# Patient Record
Sex: Female | Born: 1985 | Race: White | Hispanic: No | Marital: Single | Smoking: Never smoker
Health system: Southern US, Community
[De-identification: ages and names within clinical notes are randomized; demographics above are authoritative.]

## PROBLEM LIST (undated history)

## (undated) DIAGNOSIS — F319 Bipolar disorder, unspecified: Secondary | ICD-10-CM

## (undated) DIAGNOSIS — K5900 Constipation, unspecified: Secondary | ICD-10-CM

## (undated) DIAGNOSIS — K859 Acute pancreatitis without necrosis or infection, unspecified: Secondary | ICD-10-CM

## (undated) DIAGNOSIS — G801 Spastic diplegic cerebral palsy: Secondary | ICD-10-CM

## (undated) DIAGNOSIS — J45909 Unspecified asthma, uncomplicated: Secondary | ICD-10-CM

## (undated) HISTORY — PX: CHOLECYSTECTOMY: SHX55

## (undated) HISTORY — PX: OTHER SURGICAL HISTORY: SHX169

## (undated) HISTORY — PX: EYE SURGERY: SHX253

## (undated) HISTORY — PX: BACLOFEN PUMP REFILL: SHX1212

## (undated) HISTORY — PX: APPENDECTOMY: SHX54

## (undated) HISTORY — PX: URINARY DIVERSION: SHX2623

---

## 2012-07-31 ENCOUNTER — Ambulatory Visit (INDEPENDENT_AMBULATORY_CARE_PROVIDER_SITE_OTHER): Payer: Medicaid Other | Admitting: Psychiatry

## 2012-07-31 DIAGNOSIS — F319 Bipolar disorder, unspecified: Secondary | ICD-10-CM

## 2012-07-31 DIAGNOSIS — F0630 Mood disorder due to known physiological condition, unspecified: Secondary | ICD-10-CM

## 2012-08-07 ENCOUNTER — Ambulatory Visit (INDEPENDENT_AMBULATORY_CARE_PROVIDER_SITE_OTHER): Payer: Medicaid Other | Admitting: Psychiatry

## 2012-08-07 DIAGNOSIS — F0630 Mood disorder due to known physiological condition, unspecified: Secondary | ICD-10-CM

## 2012-08-14 ENCOUNTER — Ambulatory Visit (INDEPENDENT_AMBULATORY_CARE_PROVIDER_SITE_OTHER): Payer: Medicaid Other | Admitting: Psychiatry

## 2012-08-14 DIAGNOSIS — F0630 Mood disorder due to known physiological condition, unspecified: Secondary | ICD-10-CM

## 2012-08-14 DIAGNOSIS — F319 Bipolar disorder, unspecified: Secondary | ICD-10-CM

## 2012-08-21 ENCOUNTER — Ambulatory Visit (INDEPENDENT_AMBULATORY_CARE_PROVIDER_SITE_OTHER): Payer: Medicaid Other | Admitting: Psychiatry

## 2012-08-21 DIAGNOSIS — F0630 Mood disorder due to known physiological condition, unspecified: Secondary | ICD-10-CM

## 2012-08-21 DIAGNOSIS — F319 Bipolar disorder, unspecified: Secondary | ICD-10-CM

## 2012-08-28 ENCOUNTER — Ambulatory Visit (INDEPENDENT_AMBULATORY_CARE_PROVIDER_SITE_OTHER): Payer: Medicaid Other | Admitting: Psychiatry

## 2012-08-28 DIAGNOSIS — F0630 Mood disorder due to known physiological condition, unspecified: Secondary | ICD-10-CM

## 2012-08-28 DIAGNOSIS — F319 Bipolar disorder, unspecified: Secondary | ICD-10-CM

## 2012-09-04 ENCOUNTER — Ambulatory Visit (INDEPENDENT_AMBULATORY_CARE_PROVIDER_SITE_OTHER): Payer: Medicaid Other | Admitting: Psychiatry

## 2012-09-04 DIAGNOSIS — F0630 Mood disorder due to known physiological condition, unspecified: Secondary | ICD-10-CM

## 2012-09-04 DIAGNOSIS — F319 Bipolar disorder, unspecified: Secondary | ICD-10-CM

## 2012-09-11 ENCOUNTER — Ambulatory Visit (INDEPENDENT_AMBULATORY_CARE_PROVIDER_SITE_OTHER): Payer: Medicaid Other | Admitting: Psychiatry

## 2012-09-11 DIAGNOSIS — F319 Bipolar disorder, unspecified: Secondary | ICD-10-CM

## 2012-09-11 DIAGNOSIS — F0630 Mood disorder due to known physiological condition, unspecified: Secondary | ICD-10-CM

## 2012-09-18 ENCOUNTER — Ambulatory Visit (INDEPENDENT_AMBULATORY_CARE_PROVIDER_SITE_OTHER): Payer: Medicaid Other | Admitting: Psychiatry

## 2012-09-18 DIAGNOSIS — F0630 Mood disorder due to known physiological condition, unspecified: Secondary | ICD-10-CM

## 2012-09-18 DIAGNOSIS — F319 Bipolar disorder, unspecified: Secondary | ICD-10-CM

## 2012-09-25 ENCOUNTER — Ambulatory Visit (INDEPENDENT_AMBULATORY_CARE_PROVIDER_SITE_OTHER): Payer: Medicaid Other | Admitting: Psychiatry

## 2012-09-25 DIAGNOSIS — F0630 Mood disorder due to known physiological condition, unspecified: Secondary | ICD-10-CM

## 2012-09-25 DIAGNOSIS — F319 Bipolar disorder, unspecified: Secondary | ICD-10-CM

## 2012-10-02 ENCOUNTER — Ambulatory Visit (INDEPENDENT_AMBULATORY_CARE_PROVIDER_SITE_OTHER): Payer: 59 | Admitting: Psychiatry

## 2012-10-02 DIAGNOSIS — F319 Bipolar disorder, unspecified: Secondary | ICD-10-CM

## 2012-10-02 DIAGNOSIS — F0630 Mood disorder due to known physiological condition, unspecified: Secondary | ICD-10-CM

## 2012-10-09 ENCOUNTER — Ambulatory Visit (INDEPENDENT_AMBULATORY_CARE_PROVIDER_SITE_OTHER): Payer: 59 | Admitting: Psychiatry

## 2012-10-09 DIAGNOSIS — F068 Other specified mental disorders due to known physiological condition: Secondary | ICD-10-CM

## 2012-10-09 DIAGNOSIS — F319 Bipolar disorder, unspecified: Secondary | ICD-10-CM

## 2012-10-16 ENCOUNTER — Ambulatory Visit (INDEPENDENT_AMBULATORY_CARE_PROVIDER_SITE_OTHER): Payer: Medicaid Other | Admitting: Psychiatry

## 2012-10-16 DIAGNOSIS — F0630 Mood disorder due to known physiological condition, unspecified: Secondary | ICD-10-CM

## 2012-10-16 DIAGNOSIS — F319 Bipolar disorder, unspecified: Secondary | ICD-10-CM

## 2012-10-23 ENCOUNTER — Ambulatory Visit (INDEPENDENT_AMBULATORY_CARE_PROVIDER_SITE_OTHER): Payer: Medicaid Other | Admitting: Psychiatry

## 2012-10-23 DIAGNOSIS — F0630 Mood disorder due to known physiological condition, unspecified: Secondary | ICD-10-CM

## 2012-10-23 DIAGNOSIS — F319 Bipolar disorder, unspecified: Secondary | ICD-10-CM

## 2012-10-30 ENCOUNTER — Ambulatory Visit (INDEPENDENT_AMBULATORY_CARE_PROVIDER_SITE_OTHER): Payer: Medicaid Other | Admitting: Psychiatry

## 2012-10-30 DIAGNOSIS — F0630 Mood disorder due to known physiological condition, unspecified: Secondary | ICD-10-CM

## 2012-10-30 DIAGNOSIS — F319 Bipolar disorder, unspecified: Secondary | ICD-10-CM

## 2012-11-06 ENCOUNTER — Ambulatory Visit (INDEPENDENT_AMBULATORY_CARE_PROVIDER_SITE_OTHER): Payer: Medicaid Other | Admitting: Psychiatry

## 2012-11-06 DIAGNOSIS — F319 Bipolar disorder, unspecified: Secondary | ICD-10-CM

## 2012-11-06 DIAGNOSIS — F0630 Mood disorder due to known physiological condition, unspecified: Secondary | ICD-10-CM

## 2012-11-13 ENCOUNTER — Ambulatory Visit: Payer: Medicaid Other | Admitting: Psychiatry

## 2012-12-05 ENCOUNTER — Ambulatory Visit (INDEPENDENT_AMBULATORY_CARE_PROVIDER_SITE_OTHER): Payer: Medicaid Other | Admitting: Psychiatry

## 2012-12-05 DIAGNOSIS — F319 Bipolar disorder, unspecified: Secondary | ICD-10-CM

## 2012-12-05 DIAGNOSIS — F0630 Mood disorder due to known physiological condition, unspecified: Secondary | ICD-10-CM

## 2012-12-11 ENCOUNTER — Ambulatory Visit (INDEPENDENT_AMBULATORY_CARE_PROVIDER_SITE_OTHER): Payer: Medicaid Other | Admitting: Psychiatry

## 2012-12-11 DIAGNOSIS — F319 Bipolar disorder, unspecified: Secondary | ICD-10-CM

## 2012-12-11 DIAGNOSIS — F0630 Mood disorder due to known physiological condition, unspecified: Secondary | ICD-10-CM

## 2012-12-25 ENCOUNTER — Ambulatory Visit (INDEPENDENT_AMBULATORY_CARE_PROVIDER_SITE_OTHER): Payer: Medicaid Other | Admitting: Psychiatry

## 2012-12-25 DIAGNOSIS — F319 Bipolar disorder, unspecified: Secondary | ICD-10-CM

## 2012-12-25 DIAGNOSIS — F0630 Mood disorder due to known physiological condition, unspecified: Secondary | ICD-10-CM

## 2013-01-08 ENCOUNTER — Ambulatory Visit (INDEPENDENT_AMBULATORY_CARE_PROVIDER_SITE_OTHER): Payer: Medicaid Other | Admitting: Psychiatry

## 2013-01-08 DIAGNOSIS — F319 Bipolar disorder, unspecified: Secondary | ICD-10-CM

## 2013-01-08 DIAGNOSIS — F0630 Mood disorder due to known physiological condition, unspecified: Secondary | ICD-10-CM

## 2013-01-15 ENCOUNTER — Ambulatory Visit (INDEPENDENT_AMBULATORY_CARE_PROVIDER_SITE_OTHER): Payer: Medicaid Other | Admitting: Psychiatry

## 2013-01-15 DIAGNOSIS — F0630 Mood disorder due to known physiological condition, unspecified: Secondary | ICD-10-CM

## 2013-01-15 DIAGNOSIS — F319 Bipolar disorder, unspecified: Secondary | ICD-10-CM

## 2013-01-22 ENCOUNTER — Ambulatory Visit (INDEPENDENT_AMBULATORY_CARE_PROVIDER_SITE_OTHER): Payer: Medicaid Other | Admitting: Psychiatry

## 2013-01-22 DIAGNOSIS — F319 Bipolar disorder, unspecified: Secondary | ICD-10-CM

## 2013-01-22 DIAGNOSIS — F0630 Mood disorder due to known physiological condition, unspecified: Secondary | ICD-10-CM

## 2013-01-27 ENCOUNTER — Encounter (HOSPITAL_COMMUNITY): Payer: Self-pay | Admitting: *Deleted

## 2013-01-27 ENCOUNTER — Emergency Department (HOSPITAL_COMMUNITY): Payer: Medicaid Other

## 2013-01-27 ENCOUNTER — Emergency Department (HOSPITAL_COMMUNITY)
Admission: EM | Admit: 2013-01-27 | Discharge: 2013-01-27 | Disposition: A | Discharge status: Skilled Nursing Facility | Payer: Medicaid Other | Attending: Emergency Medicine | Admitting: Emergency Medicine

## 2013-01-27 DIAGNOSIS — R109 Unspecified abdominal pain: Secondary | ICD-10-CM | POA: Insufficient documentation

## 2013-01-27 DIAGNOSIS — J45901 Unspecified asthma with (acute) exacerbation: Secondary | ICD-10-CM | POA: Insufficient documentation

## 2013-01-27 DIAGNOSIS — Z8669 Personal history of other diseases of the nervous system and sense organs: Secondary | ICD-10-CM | POA: Insufficient documentation

## 2013-01-27 DIAGNOSIS — Z9089 Acquired absence of other organs: Secondary | ICD-10-CM | POA: Insufficient documentation

## 2013-01-27 DIAGNOSIS — E669 Obesity, unspecified: Secondary | ICD-10-CM | POA: Insufficient documentation

## 2013-01-27 DIAGNOSIS — R05 Cough: Secondary | ICD-10-CM | POA: Insufficient documentation

## 2013-01-27 DIAGNOSIS — R059 Cough, unspecified: Secondary | ICD-10-CM | POA: Insufficient documentation

## 2013-01-27 DIAGNOSIS — F319 Bipolar disorder, unspecified: Secondary | ICD-10-CM | POA: Insufficient documentation

## 2013-01-27 DIAGNOSIS — R0989 Other specified symptoms and signs involving the circulatory and respiratory systems: Secondary | ICD-10-CM | POA: Insufficient documentation

## 2013-01-27 DIAGNOSIS — Z79899 Other long term (current) drug therapy: Secondary | ICD-10-CM | POA: Insufficient documentation

## 2013-01-27 DIAGNOSIS — IMO0002 Reserved for concepts with insufficient information to code with codable children: Secondary | ICD-10-CM | POA: Insufficient documentation

## 2013-01-27 DIAGNOSIS — Z8719 Personal history of other diseases of the digestive system: Secondary | ICD-10-CM | POA: Insufficient documentation

## 2013-01-27 DIAGNOSIS — J069 Acute upper respiratory infection, unspecified: Secondary | ICD-10-CM

## 2013-01-27 HISTORY — DX: Spastic diplegic cerebral palsy: G80.1

## 2013-01-27 HISTORY — DX: Bipolar disorder, unspecified: F31.9

## 2013-01-27 HISTORY — DX: Acute pancreatitis without necrosis or infection, unspecified: K85.90

## 2013-01-27 HISTORY — DX: Unspecified asthma, uncomplicated: J45.909

## 2013-01-27 LAB — CBC WITH DIFFERENTIAL/PLATELET
Basophils Absolute: 0.1 10*3/uL (ref 0.0–0.1)
Basophils Relative: 0 % (ref 0–1)
Eosinophils Absolute: 0.6 10*3/uL (ref 0.0–0.7)
Eosinophils Relative: 4 % (ref 0–5)
Lymphocytes Relative: 32 % (ref 12–46)
MCHC: 32.1 g/dL (ref 30.0–36.0)
MCV: 90.6 fL (ref 78.0–100.0)
Monocytes Absolute: 0.8 10*3/uL (ref 0.1–1.0)
Platelets: 305 10*3/uL (ref 150–400)
RDW: 13 % (ref 11.5–15.5)
WBC: 14 10*3/uL — ABNORMAL HIGH (ref 4.0–10.5)

## 2013-01-27 LAB — COMPREHENSIVE METABOLIC PANEL
ALT: 18 U/L (ref 0–35)
CO2: 23 mEq/L (ref 19–32)
Calcium: 9.1 mg/dL (ref 8.4–10.5)
Creatinine, Ser: 0.55 mg/dL (ref 0.50–1.10)
GFR calc Af Amer: >90 mL/min (ref >90)
GFR calc non Af Amer: >90 mL/min (ref >90)
Glucose, Bld: 98 mg/dL (ref 70–99)
Total Bilirubin: 0.1 mg/dL — ABNORMAL LOW (ref 0.3–1.2)

## 2013-01-27 LAB — URINALYSIS, ROUTINE W REFLEX MICROSCOPIC
Ketones, ur: NEGATIVE mg/dL
Protein, ur: NEGATIVE mg/dL
Urobilinogen, UA: 0.2 mg/dL (ref 0.0–1.0)

## 2013-01-27 LAB — URINE MICROSCOPIC-ADD ON

## 2013-01-27 MED ORDER — SODIUM CHLORIDE 0.9 % IV SOLN: 1000.0000 mL | INTRAVENOUS | Status: DC

## 2013-01-27 MED ORDER — IOHEXOL 350 MG/ML SOLN
100.0000 mL | Freq: Once | INTRAVENOUS | Status: AC | PRN
  Administered 2013-01-27: 100 mL via INTRAVENOUS

## 2013-01-27 MED ORDER — ONDANSETRON HCL 4 MG/2ML IJ SOLN
4.0000 mg | Freq: Once | INTRAMUSCULAR | Status: AC
Start: 2013-01-27 — End: 2013-01-27
  Administered 2013-01-27: 4 mg via INTRAVENOUS
  Filled 2013-01-27: qty 2

## 2013-01-27 MED ORDER — ALBUTEROL SULFATE HFA 108 (90 BASE) MCG/ACT IN AERS: 1.0000 | INHALATION_SPRAY | Freq: Four times a day (QID) | RESPIRATORY_TRACT | Status: AC | PRN

## 2013-01-27 MED ORDER — HYDROMORPHONE HCL PF 1 MG/ML IJ SOLN
1.0000 mg | Freq: Once | INTRAMUSCULAR | Status: AC
  Administered 2013-01-27: 1 mg via INTRAVENOUS
  Filled 2013-01-27: qty 1

## 2013-01-27 MED ORDER — GUAIFENESIN ER 1200 MG PO TB12: 1.0000 | ORAL_TABLET | Freq: Two times a day (BID) | ORAL | Status: DC

## 2013-01-27 MED ORDER — SODIUM CHLORIDE 0.9 % IV SOLN
1000.0000 mL | Freq: Once | INTRAVENOUS | Status: AC
  Administered 2013-01-27: 1000 mL via INTRAVENOUS

## 2013-01-27 NOTE — ED Notes (Signed)
[facility] called for transport.  

## 2013-01-27 NOTE — ED Provider Notes (Addendum)
History     CSN: [id number] Arrival date & time 01/27/13  1733 First MD Initiated Contact with Patient 01/27/13 1739      CC: shortness of breath  HPI Patient presents to the emergency room with complaints of shortness of breath, and cough. The symptoms started about one week ago. She was prescribed an antibiotic which she completed a few days ago. She still feels like she has a lot of congestion in her chest but she can't bring up. Nothing seems to help the symptoms. Patient has cerebral palsy and does not ambulate.  She also has had some moderate left upper quadrant and flank pain. She has a history of pancreatitis and feels that this could be somewhat similar. She has not had any trouble with nausea vomiting or diarrhea. She has had some constipation and was treated for that last week. Past Medical History  Diagnosis Date  . Asthma   . Bipolar 1 disorder   . Cerebral palsy with spastic/ataxic diplegia     spastic  . Pancreatitis     Past Surgical History  Procedure Laterality Date  . Appendectomy    . Cholecystectomy    . Major lower body reconstruction    . Eye surgery    . Baclofen pump refill      RLQ    Family History  Problem Relation Age of Onset  . Family history unknown: Yes    History  Substance Use Topics  . Smoking status: Never Smoker   . Smokeless tobacco: Not on file  . Alcohol Use: 0.6 oz/week    1 Shots of liquor per week     Comment: social drinker 1x  a moth     OB History   Grav Para Term Preterm Abortions TAB SAB Ect Mult Living   0 0 0 0 0 0 0 0 0 0       Review of Systems  All other systems reviewed and are negative.    Allergies  Methadone hcl; Morphine and related; and Versed  Home Medications   Current Outpatient Rx  Name  Route  Sig  Dispense  Refill  . albuterol (PROVENTIL) (2.5 MG/3ML) 0.083% nebulizer solution   Nebulization   Take 2.5 mg by nebulization every 4 (four) hours as needed (cough and wheezing).         .  ARIPiprazole (ABILIFY) 2 MG tablet   Oral   Take 2 mg by mouth at bedtime.         . benzonatate (TESSALON) 100 MG capsule   Oral   Take 100 mg by mouth 3 (three) times daily as needed (cough).         . cholecalciferol (VITAMIN D) 1000 UNITS tablet   Oral   Take 1,000 Units by mouth every morning.         . gabapentin (NEURONTIN) 100 MG capsule   Oral   Take 100 mg by mouth 2 (two) times daily.         Marland Kitchen lithium carbonate 150 MG capsule   Oral   Take 150 mg by mouth at bedtime. Also takes 2 (300mg ) tablets together         . lithium carbonate 300 MG capsule   Oral   Take 600 mg by mouth at bedtime. Also takes 1 (150mg ) tablet together         . loratadine (CLARITIN) 10 MG tablet   Oral   Take 10 mg by mouth daily as needed (post-nasal drip).         Marland Kitchen  neomycin-polymyxin-hydrocortisone (CORTISPORIN) otic solution   Otic   Place 2 drops in ear(s) 4 (four) times daily.         Marland Kitchen oxyCODONE-acetaminophen (PERCOCET/ROXICET) 5-325 MG per tablet   Oral   Take 1 tablet by mouth every 6 (six) hours as needed (pain).         . polyethylene glycol (MIRALAX / GLYCOLAX) packet   Oral   Take 17 g by mouth daily.         . tamsulosin (FLOMAX) 0.4 MG CAPS   Oral   Take 0.4 mg by mouth daily after supper.           BP 131/74  Temp(Src) 99.3 F (37.4 C) (Oral)  Resp 17  SpO2 100%  Physical Exam  Nursing note and vitals reviewed. Constitutional: No distress.  Obese   HENT:  Head: Normocephalic and atraumatic.  Right Ear: External ear normal.  Left Ear: External ear normal.  Eyes: Conjunctivae are normal. Right eye exhibits no discharge. Left eye exhibits no discharge. No scleral icterus.  Neck: Neck supple. No tracheal deviation present.  Cardiovascular: Normal rate, regular rhythm and intact distal pulses.   Pulmonary/Chest: Effort normal and breath sounds normal. No stridor. No respiratory distress. She has no wheezes. She has no rales.   Abdominal: Soft. Bowel sounds are normal. She exhibits no distension. There is tenderness. There is no rebound and no guarding.  Mild epigastrum and luq  Musculoskeletal: She exhibits no edema and no tenderness.  Neurological: She is alert. She is not disoriented. No sensory deficit. Cranial nerve deficit:  no gross defecits noted. She exhibits abnormal muscle tone. She displays no seizure activity.  Weakness bilateral lower extrem, ue strength equal bilaterally  Skin: Skin is warm and dry. No rash noted.  Psychiatric: She has a normal mood and affect.    ED Course  Procedures (including critical care time) EKG Normal sinus rhythm Rate 80 Normal axis normal intervals Nonspecific T wave changes No prior EKG for comparison Labs Reviewed  COMPREHENSIVE METABOLIC PANEL - Abnormal; Notable for the following:    Total Bilirubin 0.1 (*)    All other components within normal limits  URINALYSIS, ROUTINE W REFLEX MICROSCOPIC - Abnormal; Notable for the following:    Hgb urine dipstick TRACE (*)    Leukocytes, UA SMALL (*)    All other components within normal limits  CBC WITH DIFFERENTIAL - Abnormal; Notable for the following:    WBC 14.0 (*)    Neutro Abs 8.0 (*)    Lymphs Abs 4.5 (*)    All other components within normal limits  URINE MICROSCOPIC-ADD ON - Abnormal; Notable for the following:    Squamous Epithelial / LPF FEW (*)    All other components within normal limits  LIPASE, BLOOD   Dg Abd Acute W/chest  01/27/2013  *RADIOLOGY REPORT*  Clinical Data: Shortness of breath.  Abdominal pain.  ACUTE ABDOMEN SERIES (ABDOMEN 2 VIEW & CHEST 1 VIEW)  Comparison: None.  Findings: There are low lung volumes.  The heart size and mediastinal contours are normal.  The lungs are clear.  There is no pleural effusion or pneumothorax.  Multiple telemetry leads overlie the chest.  There is an intrathecal catheter with an imbedded pump in the right abdominal wall.  The bowel gas pattern appears  nonobstructive. There is mildly prominent stool within the right colon.  There is no free intraperitoneal air or suspicious calcification. Cholecystectomy clips are noted.  There are bilateral pelvic calcifications  which are likely phleboliths.  Both hips demonstrate coxa valga deformities and femoral head irregularities suspicious for possible chronic osteonecrosis.  IMPRESSION: No acute cardiopulmonary or abdominal process identified.   Original Report Authenticated By: Carey Bullocks, M.D.      1. URI, acute   2. Abdominal pain       MDM  Labs and xrays unremarkable.  Room air o2 sat in mid to low 90s.  At times tachycardic. No history of PE however pt is imobile.  Previously she has had elevated d dimers with normal CT scans.  Will proceed with CT to rule out PE.  CT scan did not show evidence of pulmonary embolism. I doubt pulmonary edema at her age. The patient's abdominal exam is benign. Her symptoms are most likely related to a viral infection. Patient be discharged home and recommend follow up with her primary doctor this week        Celene Kras, MD 01/27/13 2138  Celene Kras, MD 01/27/13 2145

## 2013-01-27 NOTE — ED Notes (Signed)
Patient transported to CT 

## 2013-01-27 NOTE — ED Notes (Signed)
EAV:[id number]> Expected date:01/27/13<BR> Expected time: 5:23 PM<BR> Means of arrival:Ambulance<BR> Comments:<BR> LRQ pain, SHob

## 2013-01-27 NOTE — ED Notes (Signed)
Pt's O2 went down to 88% and held around 91%.  Put pt on 1LNC O2 pt's O2 sat now 97%.  MD made aware.

## 2013-01-27 NOTE — ED Notes (Signed)
Patient had URI two weeks ago was taking an antibiotic just finished up a few days ago.  Patient c/o of SOB and LLQ pain radiating flank of back.

## 2013-01-29 ENCOUNTER — Ambulatory Visit: Payer: Medicaid Other | Admitting: Psychiatry

## 2013-01-30 ENCOUNTER — Inpatient Hospital Stay (HOSPITAL_COMMUNITY)
Admission: EM | Admit: 2013-01-30 | Discharge: 2013-02-02 | DRG: 690 | Disposition: A | Discharge status: Home / Self Care | Payer: Medicaid Other | Attending: Internal Medicine | Admitting: Internal Medicine

## 2013-01-30 DIAGNOSIS — K5900 Constipation, unspecified: Secondary | ICD-10-CM | POA: Diagnosis present

## 2013-01-30 DIAGNOSIS — G808 Other cerebral palsy: Secondary | ICD-10-CM | POA: Diagnosis present

## 2013-01-30 DIAGNOSIS — N10 Acute tubulo-interstitial nephritis: Principal | ICD-10-CM | POA: Diagnosis present

## 2013-01-30 DIAGNOSIS — Z79899 Other long term (current) drug therapy: Secondary | ICD-10-CM

## 2013-01-30 DIAGNOSIS — F319 Bipolar disorder, unspecified: Secondary | ICD-10-CM | POA: Diagnosis present

## 2013-01-30 DIAGNOSIS — G801 Spastic diplegic cerebral palsy: Secondary | ICD-10-CM

## 2013-01-30 DIAGNOSIS — J45909 Unspecified asthma, uncomplicated: Secondary | ICD-10-CM | POA: Diagnosis present

## 2013-01-30 DIAGNOSIS — E860 Dehydration: Secondary | ICD-10-CM

## 2013-01-30 LAB — CBC WITH DIFFERENTIAL/PLATELET
HCT: 40.5 % (ref 36.0–46.0)
Hemoglobin: 13.6 g/dL (ref 12.0–15.0)
Lymphocytes Relative: 19 % (ref 12–46)
Lymphs Abs: 2.8 10*3/uL (ref 0.7–4.0)
MCHC: 33.6 g/dL (ref 30.0–36.0)
Monocytes Absolute: 0.8 10*3/uL (ref 0.1–1.0)
Monocytes Relative: 5 % (ref 3–12)
Neutro Abs: 11.1 10*3/uL — ABNORMAL HIGH (ref 1.7–7.7)
Neutrophils Relative %: 74 % (ref 43–77)
RBC: 4.54 MIL/uL (ref 3.87–5.11)

## 2013-01-30 LAB — COMPREHENSIVE METABOLIC PANEL
Albumin: 3.7 g/dL (ref 3.5–5.2)
Alkaline Phosphatase: 115 U/L (ref 39–117)
BUN: 8 mg/dL (ref 6–23)
CO2: 23 mEq/L (ref 19–32)
Chloride: 107 mEq/L (ref 96–112)
Creatinine, Ser: 0.51 mg/dL (ref 0.50–1.10)
GFR calc non Af Amer: >90 mL/min (ref >90)
Glucose, Bld: 146 mg/dL — ABNORMAL HIGH (ref 70–99)
Potassium: 4.5 mEq/L (ref 3.5–5.1)
Total Bilirubin: <0.1 mg/dL — ABNORMAL LOW (ref 0.3–1.2)

## 2013-01-30 LAB — URINE MICROSCOPIC-ADD ON

## 2013-01-30 LAB — URINALYSIS, ROUTINE W REFLEX MICROSCOPIC
Bilirubin Urine: NEGATIVE
Ketones, ur: NEGATIVE mg/dL
Nitrite: NEGATIVE
Protein, ur: NEGATIVE mg/dL
pH: 7 (ref 5.0–8.0)

## 2013-01-30 LAB — LIPASE, BLOOD: Lipase: 19 U/L (ref 11–59)

## 2013-01-30 MED ORDER — FLEET ENEMA 7-19 GM/118ML RE ENEM
1.0000 | ENEMA | Freq: Once | RECTAL | Status: AC
  Administered 2013-01-30: 1 via RECTAL
  Filled 2013-01-30: qty 1

## 2013-01-30 MED ORDER — SODIUM CHLORIDE 0.9 % IV BOLUS (SEPSIS)
1000.0000 mL | Freq: Once | INTRAVENOUS | Status: AC
  Administered 2013-01-30: 1000 mL via INTRAVENOUS

## 2013-01-30 MED ORDER — DEXTROSE 5 % IV SOLN
1.0000 g | Freq: Once | INTRAVENOUS | Status: AC
  Administered 2013-01-30: 1 g via INTRAVENOUS
  Filled 2013-01-30: qty 10

## 2013-01-30 MED ORDER — ONDANSETRON HCL 4 MG/2ML IJ SOLN
4.0000 mg | Freq: Once | INTRAMUSCULAR | Status: AC
  Administered 2013-01-30: 4 mg via INTRAVENOUS
  Filled 2013-01-30: qty 2

## 2013-01-30 NOTE — ED Notes (Signed)
Patient is a resident of [facility] who was on the city bus when she started vomiting. Patient states she thinks she has a bowel obstruction. She has not had a BM in 5 days.

## 2013-01-31 ENCOUNTER — Encounter (HOSPITAL_COMMUNITY): Payer: Self-pay | Admitting: *Deleted

## 2013-01-31 DIAGNOSIS — F319 Bipolar disorder, unspecified: Secondary | ICD-10-CM

## 2013-01-31 DIAGNOSIS — E860 Dehydration: Secondary | ICD-10-CM

## 2013-01-31 DIAGNOSIS — N10 Acute tubulo-interstitial nephritis: Secondary | ICD-10-CM | POA: Diagnosis present

## 2013-01-31 DIAGNOSIS — G801 Spastic diplegic cerebral palsy: Secondary | ICD-10-CM | POA: Diagnosis present

## 2013-01-31 DIAGNOSIS — G808 Other cerebral palsy: Secondary | ICD-10-CM

## 2013-01-31 LAB — HEMOGLOBIN A1C: Mean Plasma Glucose: 117 mg/dL — ABNORMAL HIGH (ref <117)

## 2013-01-31 LAB — LITHIUM LEVEL: Lithium Lvl: <0.25 mEq/L — ABNORMAL LOW (ref 0.80–1.40)

## 2013-01-31 MED ORDER — LITHIUM CARBONATE 150 MG PO CAPS
750.0000 mg | ORAL_CAPSULE | Freq: Every day | ORAL | Status: DC
  Administered 2013-01-31 – 2013-02-01 (×3): 750 mg via ORAL
  Filled 2013-01-31 (×4): qty 1

## 2013-01-31 MED ORDER — POLYETHYLENE GLYCOL 3350 17 G PO PACK
17.0000 g | PACK | Freq: Every day | ORAL | Status: DC
  Administered 2013-01-31 – 2013-02-02 (×3): 17 g via ORAL
  Filled 2013-01-31 (×3): qty 1

## 2013-01-31 MED ORDER — ALBUTEROL SULFATE (5 MG/ML) 0.5% IN NEBU: 2.5000 mg | INHALATION_SOLUTION | RESPIRATORY_TRACT | Status: DC | PRN

## 2013-01-31 MED ORDER — NEOMYCIN-POLYMYXIN-HC 3.5-10000-1 OT SOLN
2.0000 [drp] | Freq: Four times a day (QID) | OTIC | Status: DC
  Administered 2013-01-31 – 2013-02-02 (×10): 2 [drp] via OTIC
  Filled 2013-01-31: qty 10

## 2013-01-31 MED ORDER — ALBUTEROL SULFATE HFA 108 (90 BASE) MCG/ACT IN AERS: 1.0000 | INHALATION_SPRAY | Freq: Four times a day (QID) | RESPIRATORY_TRACT | Status: DC | PRN

## 2013-01-31 MED ORDER — OXYCODONE-ACETAMINOPHEN 5-325 MG PO TABS
1.0000 | ORAL_TABLET | Freq: Four times a day (QID) | ORAL | Status: DC | PRN
  Administered 2013-01-31 (×2): 1 via ORAL
  Filled 2013-01-31 (×2): qty 1

## 2013-01-31 MED ORDER — LITHIUM CARBONATE 150 MG PO CAPS: 150.0000 mg | ORAL_CAPSULE | Freq: Every day | ORAL | Status: DC

## 2013-01-31 MED ORDER — OXYCODONE-ACETAMINOPHEN 5-325 MG PO TABS
1.0000 | ORAL_TABLET | ORAL | Status: DC | PRN
  Administered 2013-01-31 – 2013-02-02 (×4): 1 via ORAL
  Filled 2013-01-31 (×4): qty 1

## 2013-01-31 MED ORDER — HYDROCERIN EX CREA
1.0000 "application " | TOPICAL_CREAM | Freq: Every day | CUTANEOUS | Status: DC
  Administered 2013-01-31 – 2013-02-01 (×2): 1 via TOPICAL
  Filled 2013-01-31: qty 113

## 2013-01-31 MED ORDER — SODIUM CHLORIDE 0.9 % IV SOLN
INTRAVENOUS | Status: DC
  Administered 2013-01-31 – 2013-02-02 (×5): via INTRAVENOUS

## 2013-01-31 MED ORDER — VITAMIN D3 25 MCG (1000 UNIT) PO TABS
1000.0000 [IU] | ORAL_TABLET | Freq: Every morning | ORAL | Status: DC
  Administered 2013-01-31 – 2013-02-02 (×3): 1000 [IU] via ORAL
  Filled 2013-01-31 (×3): qty 1

## 2013-01-31 MED ORDER — ARIPIPRAZOLE 2 MG PO TABS
4.0000 mg | ORAL_TABLET | Freq: Every day | ORAL | Status: DC
  Administered 2013-01-31 – 2013-02-01 (×3): 4 mg via ORAL
  Filled 2013-01-31 (×4): qty 2

## 2013-01-31 MED ORDER — ALPRAZOLAM 0.5 MG PO TABS: 0.5000 mg | ORAL_TABLET | Freq: Three times a day (TID) | ORAL | Status: DC | PRN

## 2013-01-31 MED ORDER — ENOXAPARIN SODIUM 40 MG/0.4ML ~~LOC~~ SOLN
40.0000 mg | SUBCUTANEOUS | Status: DC
  Administered 2013-01-31 – 2013-02-02 (×3): 40 mg via SUBCUTANEOUS
  Filled 2013-01-31 (×3): qty 0.4

## 2013-01-31 MED ORDER — ACETAMINOPHEN 325 MG PO TABS: 650.0000 mg | ORAL_TABLET | ORAL | Status: DC | PRN

## 2013-01-31 MED ORDER — SENNOSIDES-DOCUSATE SODIUM 8.6-50 MG PO TABS
3.0000 | ORAL_TABLET | Freq: Two times a day (BID) | ORAL | Status: DC
  Administered 2013-01-31 – 2013-02-02 (×5): 3 via ORAL
  Filled 2013-01-31 (×5): qty 3

## 2013-01-31 MED ORDER — BISACODYL 10 MG RE SUPP: 10.0000 mg | Freq: Every day | RECTAL | Status: DC | PRN

## 2013-01-31 MED ORDER — HYDROMORPHONE HCL PF 1 MG/ML IJ SOLN
1.0000 mg | INTRAMUSCULAR | Status: DC | PRN
Start: 2013-01-31 — End: 2013-02-02
  Administered 2013-01-31 – 2013-02-01 (×2): 1 mg via INTRAVENOUS
  Filled 2013-01-31 (×2): qty 1

## 2013-01-31 MED ORDER — TAMSULOSIN HCL 0.4 MG PO CAPS
0.4000 mg | ORAL_CAPSULE | Freq: Every day | ORAL | Status: DC
  Administered 2013-01-31 – 2013-02-01 (×2): 0.4 mg via ORAL
  Filled 2013-01-31 (×3): qty 1

## 2013-01-31 MED ORDER — ONDANSETRON HCL 4 MG PO TABS: 4.0000 mg | ORAL_TABLET | Freq: Four times a day (QID) | ORAL | Status: DC | PRN

## 2013-01-31 MED ORDER — FLEET ENEMA 7-19 GM/118ML RE ENEM: 1.0000 | ENEMA | RECTAL | Status: DC | PRN

## 2013-01-31 MED ORDER — ONDANSETRON HCL 4 MG/2ML IJ SOLN
4.0000 mg | Freq: Four times a day (QID) | INTRAMUSCULAR | Status: DC | PRN
  Administered 2013-01-31 (×2): 4 mg via INTRAVENOUS
  Filled 2013-01-31 (×2): qty 2

## 2013-01-31 MED ORDER — BACLOFEN 10 MG PO TABS
10.0000 mg | ORAL_TABLET | Freq: Four times a day (QID) | ORAL | Status: DC | PRN
  Filled 2013-01-31: qty 1

## 2013-01-31 MED ORDER — LORATADINE 10 MG PO TABS: 10.0000 mg | ORAL_TABLET | Freq: Every day | ORAL | Status: DC | PRN

## 2013-01-31 MED ORDER — MORPHINE SULFATE 2 MG/ML IJ SOLN
2.0000 mg | INTRAMUSCULAR | Status: DC | PRN
  Filled 2013-01-31: qty 1

## 2013-01-31 MED ORDER — DEXTROSE 5 % IV SOLN
1.0000 g | INTRAVENOUS | Status: DC
  Administered 2013-01-31 – 2013-02-01 (×2): 1 g via INTRAVENOUS
  Filled 2013-01-31 (×3): qty 10

## 2013-01-31 MED ORDER — GABAPENTIN 100 MG PO CAPS
100.0000 mg | ORAL_CAPSULE | Freq: Two times a day (BID) | ORAL | Status: DC
  Administered 2013-01-31 – 2013-02-02 (×6): 100 mg via ORAL
  Filled 2013-01-31 (×7): qty 1

## 2013-01-31 MED ORDER — LITHIUM CARBONATE 300 MG PO CAPS: 600.0000 mg | ORAL_CAPSULE | Freq: Every day | ORAL | Status: DC

## 2013-01-31 NOTE — Progress Notes (Signed)
Patient seen and examined by my colleague Dr. Conley Rolls. Patient seen and examined by me, database and chart reviewed. History of cerebral palsy and bipolar disorder on lithium. She came in with acute pyelonephritis, on Rocephin.  Clint Lipps Pager: [phone] 01/31/2013, 12:29 PM

## 2013-01-31 NOTE — Care Management Note (Signed)
    Page 1 of 1   02/02/2013     4:42:37 PM   CARE MANAGEMENT NOTE 02/02/2013  Patient:  Brianna Castillo, Brianna Castillo   Account Number:  [id number]  Date Initiated:  01/31/2013  Documentation initiated by:  Letha Cape  Subjective/Objective Assessment:   dx pyelonephritis  admit- from the [facility] - ALF.     Action/Plan:   Anticipated DC Date:  02/02/2013   Anticipated DC Plan:  ASSISTED LIVING / REST HOME  In-house referral  Clinical Social Worker      DC Planning Services  CM consult      Choice offered to / List presented to:             Status of service:  Completed, signed off Medicare Important Message given?   (If response is "NO", the following Medicare IM given date fields will be blank) Date Medicare IM given:   Date Additional Medicare IM given:    Discharge Disposition:  ASSISTED LIVING  Per UR Regulation:  Reviewed for med. necessity/level of care/duration of stay  If discussed at Long Length of Stay Meetings, dates discussed:    Comments:  01/31/13 9:04 Letha Cape RN, BSN [phone] patient is from the [facility] the ALF, CSW referral.

## 2013-01-31 NOTE — H&P (Signed)
[facility] History and Physical  Brianna Castillo FAO:[id number] DOB: 12/01/1985    PCP:   None  Chief Complaint: Flank pain, nausea and vomiting.  HPI:  Brianna Castillo is an 27 y.o. female with history of cerebral palsy, asthma, bipolar disorder on lithium, presents to the emergency room from assisted living, having vomited once when she was at a bus stop. She admitted to having low back pain with nausea and vomiting. She also feels subjective fever. She denied abdominal cramps or pain, black stool bloody stool. Evaluation in emergency room showed a urinalysis with evidence of infection. She has a leukocytosis with white count 15.6 thousand, hemoglobin of 13.6 g per DL. Her renal function tests were normal, and her liver function tests are normal as well. She was given intravenous Rocephin. Hospitalist was asked to admit her for further treatment since she has nausea vomiting and is coming from an assisted living.  Rewiew of Systems:  Constitutional: Negative for malaise,  No significant weight loss or weight gain Eyes: Negative for eye pain, redness and discharge, diplopia, visual changes, or flashes of light. ENMT: Negative for ear pain, hoarseness, nasal congestion, sinus pressure and sore throat. No headaches; tinnitus, drooling, or problem swallowing. Cardiovascular: Negative for chest pain, palpitations, diaphoresis, dyspnea and peripheral edema. ; No orthopnea, PND Respiratory: Negative for cough, hemoptysis, wheezing and stridor. No pleuritic chestpain. Gastrointestinal: Negative for  diarrhea, constipation, abdominal pain, melena, blood in stool, hematemesis, jaundice and rectal bleeding.    Genitourinary: Negative for frequency,  incontinence,flank pain and hematuria; Musculoskeletal: Negative for back pain and neck pain. Negative for swelling and trauma.;  Skin: . Negative for pruritus, rash, abrasions, bruising and skin lesion.; ulcerations Neuro: Negative for headache,  lightheadedness and neck stiffness. Negative for weakness, altered level of consciousness , altered mental status, extremity weakness, burning feet, involuntary movement, seizure and syncope.  Psych: negative for anxiety, depression, insomnia, tearfulness, panic attacks, hallucinations, paranoia, suicidal or homicidal ideation   Past Medical History  Diagnosis Date  . Asthma   . Bipolar 1 disorder   . Cerebral palsy with spastic/ataxic diplegia     spastic  . Pancreatitis     Past Surgical History  Procedure Laterality Date  . Appendectomy    . Cholecystectomy    . Major lower body reconstruction    . Eye surgery    . Baclofen pump refill      RLQ    Medications:  HOME MEDS: Prior to Admission medications   Medication Sig Start Date End Date Taking? Authorizing Provider  acetaminophen (TYLENOL) 325 MG tablet Take 650 mg by mouth every 4 (four) hours as needed for pain or fever.   Yes Historical Provider, MD  Acetaminophen-Caff-Pyrilamine (MIDOL MAX ST MENSTRUAL FORMULA PO) Take 2 tablets by mouth every 6 (six) hours as needed (for menstrual cramps).   Yes Historical Provider, MD  albuterol (PROVENTIL HFA;VENTOLIN HFA) 108 (90 BASE) MCG/ACT inhaler Inhale 1-2 puffs into the lungs every 6 (six) hours as needed for wheezing. 01/27/13  Yes Celene Kras, MD  albuterol (PROVENTIL) (2.5 MG/3ML) 0.083% nebulizer solution Take 2.5 mg by nebulization every 4 (four) hours as needed (cough and wheezing).   Yes Historical Provider, MD  ALPRAZolam Prudy Feeler) 0.5 MG tablet Take 0.5 mg by mouth every 8 (eight) hours as needed for anxiety.   Yes Historical Provider, MD  ARIPiprazole (ABILIFY) 2 MG tablet Take 4 mg by mouth at bedtime.    Yes Historical Provider, MD  baclofen (LIORESAL)  10 MG tablet Take 10 mg by mouth 4 (four) times daily as needed (for spasms).   Yes Historical Provider, MD  benzonatate (TESSALON) 100 MG capsule Take 100 mg by mouth 3 (three) times daily as needed (cough).   Yes  Historical Provider, MD  bisacodyl (DULCOLAX) 10 MG suppository Place 10 mg rectally daily as needed for constipation.   Yes Historical Provider, MD  cholecalciferol (VITAMIN D) 1000 UNITS tablet Take 1,000 Units by mouth every morning.   Yes Historical Provider, MD  gabapentin (NEURONTIN) 100 MG capsule Take 100 mg by mouth 2 (two) times daily.   Yes Historical Provider, MD  Guaifenesin 1200 MG TB12 Take 1 tablet (1,200 mg total) by mouth 2 (two) times daily at 10 AM and 5 PM. 01/27/13  Yes Celene Kras, MD  hydrocortisone 2.5 % lotion Apply 1 application topically 4 (four) times daily as needed (itching to lower back).   Yes Historical Provider, MD  ibuprofen (ADVIL,MOTRIN) 100 MG/5ML suspension Take 200 mg by mouth every 6 (six) hours as needed for pain.   Yes Historical Provider, MD  lithium carbonate 150 MG capsule Take 150 mg by mouth at bedtime. Also takes 2 (300mg ) tablets together   Yes Historical Provider, MD  lithium carbonate 300 MG capsule Take 600 mg by mouth at bedtime. Also takes 1 (150mg ) tablet together   Yes Historical Provider, MD  loratadine (CLARITIN) 10 MG tablet Take 10 mg by mouth daily as needed (post-nasal drip).   Yes Historical Provider, MD  neomycin-polymyxin-hydrocortisone (CORTISPORIN) otic solution Place 2 drops in ear(s) 4 (four) times daily.   Yes Historical Provider, MD  ondansetron (ZOFRAN) 4 MG tablet Take 4 mg by mouth every 6 (six) hours as needed for nausea.   Yes Historical Provider, MD  oxyCODONE-acetaminophen (PERCOCET/ROXICET) 5-325 MG per tablet Take 1 tablet by mouth every 6 (six) hours as needed (pain).   Yes Historical Provider, MD  phenol (CHLORASEPTIC) 1.4 % LIQD Use as directed 2 sprays in the mouth or throat every 2 (two) hours as needed (for sore throat).   Yes Historical Provider, MD  polyethylene glycol (MIRALAX / GLYCOLAX) packet Take 17 g by mouth daily.   Yes Historical Provider, MD  senna-docusate (SENOKOT-S) 8.6-50 MG per tablet Take 3 tablets  by mouth daily as needed for constipation.   Yes Historical Provider, MD  Skin Protectants, Misc. (MINERIN) CREA Apply 1 application topically at bedtime.   Yes Historical Provider, MD  sodium phosphate (FLEET) enema Place 1 enema rectally every three (3) days as needed (for constipation). follow package directions   Yes Historical Provider, MD  tamsulosin (FLOMAX) 0.4 MG CAPS Take 0.4 mg by mouth daily after supper.   Yes Historical Provider, MD     Allergies:  Allergies  Allergen Reactions  . Methadone Hcl   . Morphine And Related     hypoxic  . Versed (Midazolam)     Social History:   reports that she has never smoked. She does not have any smokeless tobacco history on file. She reports that she drinks about 0.6 ounces of alcohol per week. Her drug history is not on file.  Family History: Family History  Problem Relation Age of Onset  . Family history unknown: Yes     Physical Exam: Filed Vitals:   01/30/13 2315 01/30/13 2345 01/31/13 0000 01/31/13 0003  BP: 135/89 124/81  130/73  Pulse: 98 116  103  Temp:   98.8 F (37.1 C)   TempSrc:  Rectal   Resp:    18  SpO2: 99% 100%  98%   Blood pressure 130/73, pulse 103, temperature 98.8 F (37.1 C), temperature source Rectal, resp. rate 18, SpO2 98.00%.  GEN:  Pleasant  patient lying in the stretcher in no acute distress; cooperative with exam. PSYCH:  alert and oriented x4; does not appear anxious or depressed; affect is appropriate. HEENT: Mucous membranes pink and anicteric; PERRLA; EOM intact; no cervical lymphadenopathy nor thyromegaly or carotid bruit; no JVD; There were no stridor. Neck is very supple. Breasts:: Not examined CHEST WALL: No tenderness CHEST: Normal respiration, clear to auscultation bilaterally.  HEART: Regular rate and rhythm.  There are no murmur, rub, or gallops.   BACK: No kyphosis or scoliosis; no CVA tenderness ABDOMEN: soft and non-tender; no masses, no organomegaly, normal abdominal bowel  sounds; no pannus; no intertriginous candida. There is no rebound and no distention. Rectal Exam: Not done EXTREMITIES: No bone or joint deformity; age-appropriate arthropathy of the hands and knees; no edema; no ulcerations.  There is no calf tenderness. Genitalia: not examined PULSES: 2+ and symmetric SKIN: Normal hydration no rash or ulceration CNS: Cranial nerves 2-12 grossly intact no focal lateralizing neurologic deficit.  Speech is fluent; uvula elevated with phonation, facial symmetry and tongue midline lower extremities are very weak.  Labs on Admission:  Basic Metabolic Panel:  Recent Labs Lab 01/27/13 1810 01/30/13 2150  NA 141 141  K 3.7 4.5  CL 108 107  CO2 23 23  GLUCOSE 98 146*  BUN 7 8  CREATININE 0.55 0.51  CALCIUM 9.1 9.6   Liver Function Tests:  Recent Labs Lab 01/27/13 1810 01/30/13 2150  AST 16 24  ALT 18 28  ALKPHOS 112 115  BILITOT 0.1* <0.1*  PROT 7.2 7.3  ALBUMIN 3.7 3.7    Recent Labs Lab 01/27/13 1810 01/30/13 2150  LIPASE 22 19   No results found for this basename: AMMONIA,  in the last 168 hours CBC:  Recent Labs Lab 01/27/13 1810 01/30/13 2150  WBC 14.0* 15.0*  NEUTROABS 8.0* 11.1*  HGB 13.6 13.6  HCT 42.4 40.5  MCV 90.6 89.2  PLT 305 304   Cardiac Enzymes: No results found for this basename: CKTOTAL, CKMB, CKMBINDEX, TROPONINI,  in the last 168 hours  CBG: No results found for this basename: GLUCAP,  in the last 168 hours   Radiological Exams on Admission: No results found.   Assessment/Plan Present on Admission:  . Pyelonephritis, acute . Dehydration . Cerebral palsy with spastic/ataxic diplegia . Bipolar 1 disorder  PLAN:  Will admit her for acute pyelonephritis. I will give her IV Rocephin along with IV fluid for her dehydration. When she first presented she did have tachycardia with heart rate 120, after IV fluid however it is now less than 100.  We'll give her Tylenol for fever. Since she has bipolar  illness and is currently on lithium, we'll check a level and continue her lithium. With respect to her cerebral palsy, she has been there long quite well using her motorized wheelchair. She is stable, full code, and will be [facility]. I have continued her chronic medications. Thank you for allowing me to participate in the care of your nice patient  Other plans as per orders.  Code Status: Full code   Rosene Pilling, MD. [facility] Pager [phone] 7pm to 7am.  01/31/2013, 1:26 AM

## 2013-01-31 NOTE — ED Provider Notes (Signed)
History     CSN: [id number]  Arrival date & time 01/30/13  2022   First MD Initiated Contact with Patient 01/30/13 2116      Chief Complaint  Patient presents with  . Emesis    (Consider location/radiation/quality/duration/timing/severity/associated sxs/prior treatment) HPI History provided by pt.   Pt w/ mild CP who resides in ALF, presents w/ c/o left-sided abdominal pain w/ radiation through to the back x several days.  Associated w/ increased urinary frequency, vomiting and constipation.  Per prior chart, pt seen in ED for abd pain as well as URI sx on 01/27/13.  Acute abdominal series showed moderate stool burden.  Pt has been taking miralax qod as well as senna but has not had a BM in 5 days.  U/A was obtained via in and out cath at that visit and patient is concerned she may have developed a UTI.  She denies fever, hematemesis and other GU sx.  H/o pancreatitis. Past abd surgeries include appendectomy and cholecystectomy.   Past Medical History  Diagnosis Date  . Asthma   . Bipolar 1 disorder   . Cerebral palsy with spastic/ataxic diplegia     spastic  . Pancreatitis     Past Surgical History  Procedure Laterality Date  . Appendectomy    . Cholecystectomy    . Major lower body reconstruction    . Eye surgery    . Baclofen pump refill      RLQ    Family History  Problem Relation Age of Onset  . Family history unknown: Yes    History  Substance Use Topics  . Smoking status: Never Smoker   . Smokeless tobacco: Not on file  . Alcohol Use: 0.6 oz/week    1 Shots of liquor per week     Comment: social drinker 1x  a moth     OB History   Grav Para Term Preterm Abortions TAB SAB Ect Mult Living   0 0 0 0 0 0 0 0 0 0       Review of Systems  All other systems reviewed and are negative.    Allergies  Methadone hcl; Morphine and related; and Versed  Home Medications   Current Outpatient Rx  Name  Route  Sig  Dispense  Refill  . acetaminophen (TYLENOL) 325  MG tablet   Oral   Take 650 mg by mouth every 4 (four) hours as needed for pain or fever.         . Acetaminophen-Caff-Pyrilamine (MIDOL MAX ST MENSTRUAL FORMULA PO)   Oral   Take 2 tablets by mouth every 6 (six) hours as needed (for menstrual cramps).         Marland Kitchen albuterol (PROVENTIL HFA;VENTOLIN HFA) 108 (90 BASE) MCG/ACT inhaler   Inhalation   Inhale 1-2 puffs into the lungs every 6 (six) hours as needed for wheezing.   1 Inhaler   0   . albuterol (PROVENTIL) (2.5 MG/3ML) 0.083% nebulizer solution   Nebulization   Take 2.5 mg by nebulization every 4 (four) hours as needed (cough and wheezing).         . ALPRAZolam (XANAX) 0.5 MG tablet   Oral   Take 0.5 mg by mouth every 8 (eight) hours as needed for anxiety.         . ARIPiprazole (ABILIFY) 2 MG tablet   Oral   Take 4 mg by mouth at bedtime.          . baclofen (LIORESAL) 10 MG  tablet   Oral   Take 10 mg by mouth 4 (four) times daily as needed (for spasms).         . benzonatate (TESSALON) 100 MG capsule   Oral   Take 100 mg by mouth 3 (three) times daily as needed (cough).         . bisacodyl (DULCOLAX) 10 MG suppository   Rectal   Place 10 mg rectally daily as needed for constipation.         . cholecalciferol (VITAMIN D) 1000 UNITS tablet   Oral   Take 1,000 Units by mouth every morning.         . gabapentin (NEURONTIN) 100 MG capsule   Oral   Take 100 mg by mouth 2 (two) times daily.         . Guaifenesin 1200 MG TB12   Oral   Take 1 tablet (1,200 mg total) by mouth 2 (two) times daily at 10 AM and 5 PM.   14 each   0   . hydrocortisone 2.5 % lotion   Topical   Apply 1 application topically 4 (four) times daily as needed (itching to lower back).         Marland Kitchen ibuprofen (ADVIL,MOTRIN) 100 MG/5ML suspension   Oral   Take 200 mg by mouth every 6 (six) hours as needed for pain.         Marland Kitchen lithium carbonate 150 MG capsule   Oral   Take 150 mg by mouth at bedtime. Also takes 2 (300mg )  tablets together         . lithium carbonate 300 MG capsule   Oral   Take 600 mg by mouth at bedtime. Also takes 1 (150mg ) tablet together         . loratadine (CLARITIN) 10 MG tablet   Oral   Take 10 mg by mouth daily as needed (post-nasal drip).         Marland Kitchen neomycin-polymyxin-hydrocortisone (CORTISPORIN) otic solution   Otic   Place 2 drops in ear(s) 4 (four) times daily.         . ondansetron (ZOFRAN) 4 MG tablet   Oral   Take 4 mg by mouth every 6 (six) hours as needed for nausea.         Marland Kitchen oxyCODONE-acetaminophen (PERCOCET/ROXICET) 5-325 MG per tablet   Oral   Take 1 tablet by mouth every 6 (six) hours as needed (pain).         . phenol (CHLORASEPTIC) 1.4 % LIQD   Mouth/Throat   Use as directed 2 sprays in the mouth or throat every 2 (two) hours as needed (for sore throat).         . polyethylene glycol (MIRALAX / GLYCOLAX) packet   Oral   Take 17 g by mouth daily.         Marland Kitchen senna-docusate (SENOKOT-S) 8.6-50 MG per tablet   Oral   Take 3 tablets by mouth daily as needed for constipation.         . Skin Protectants, Misc. (MINERIN) CREA   Apply externally   Apply 1 application topically at bedtime.         . sodium phosphate (FLEET) enema   Rectal   Place 1 enema rectally every three (3) days as needed (for constipation). follow package directions         . tamsulosin (FLOMAX) 0.4 MG CAPS   Oral   Take 0.4 mg by mouth daily after supper.  BP 130/73  Pulse 103  Temp(Src) 98.8 F (37.1 C) (Rectal)  Resp 18  SpO2 98%  Physical Exam  Nursing note and vitals reviewed. Constitutional: She is oriented to person, place, and time. She appears well-developed and well-nourished. No distress.  HENT:  Head: Normocephalic and atraumatic.  Eyes:  Normal appearance  Neck: Normal range of motion.  Cardiovascular: Normal rate.   tachycardic  Pulmonary/Chest: Effort normal and breath sounds normal. No respiratory distress.  No coughing   Abdominal: Soft. Bowel sounds are normal. She exhibits no distension and no mass. There is no rebound and no guarding.  Mild, diffuse left-sided abdominal tenderness.  Obese.  Baclefen pump RLQ.  Genitourinary:  Mild L CVA tenderness  Musculoskeletal: Normal range of motion.  Neurological: She is alert and oriented to person, place, and time.  Skin: Skin is warm and dry. No rash noted.  Psychiatric: She has a normal mood and affect. Her behavior is normal.    ED Course  Procedures (including critical care time)  Labs Reviewed  CBC WITH DIFFERENTIAL - Abnormal; Notable for the following:    WBC 15.0 (*)    Neutro Abs 11.1 (*)    All other components within normal limits  COMPREHENSIVE METABOLIC PANEL - Abnormal; Notable for the following:    Glucose, Bld 146 (*)    Total Bilirubin <0.1 (*)    All other components within normal limits  URINALYSIS, ROUTINE W REFLEX MICROSCOPIC - Abnormal; Notable for the following:    APPearance CLOUDY (*)    Leukocytes, UA LARGE (*)    All other components within normal limits  URINE MICROSCOPIC-ADD ON - Abnormal; Notable for the following:    Bacteria, UA MANY (*)    All other components within normal limits  URINE CULTURE  LIPASE, BLOOD  POCT PREGNANCY, URINE   No results found.   1. Pyelonephritis, acute   2. Bipolar 1 disorder   3. Cerebral palsy with spastic/ataxic diplegia   4. Dehydration       MDM  26yo F w/ mild CP, non-ambulatory and living at ALF, presents to ED w/ left-side abd pain w/ radiation to L flank as well as increased urinary frequency, vomiting and constipation.   Most recent BM 5 days ago and acute abd series showed moderate stool burden in ED on 01/27/13 when she presented w/ similar complaint.  No relief w/ qod miralax and MOM.  Had in and out cath at most recent visit.  On exam, afebrile, tachycardic, mild, left-sided abd and CVA tenderness.  Labs sig for UTI and leukocytosis.  Pt received IV rocephin.  She is  concerned about going back to ALF d/t limited nursing staff and therefore sub-optimal care when she is ill.  Dr. Bebe Shaggy has examined and recommends admission.  [facility] consulted.          Otilio Miu, PA-C 01/31/13 1432

## 2013-02-01 LAB — CBC
Hemoglobin: 11.5 g/dL — ABNORMAL LOW (ref 12.0–15.0)
MCV: 88.8 fL (ref 78.0–100.0)
Platelets: 238 10*3/uL (ref 150–400)
RBC: 3.92 MIL/uL (ref 3.87–5.11)
WBC: 11.1 10*3/uL — ABNORMAL HIGH (ref 4.0–10.5)

## 2013-02-01 LAB — URINE CULTURE

## 2013-02-01 LAB — BASIC METABOLIC PANEL
CO2: 25 mEq/L (ref 19–32)
Calcium: 8.9 mg/dL (ref 8.4–10.5)
GFR calc non Af Amer: >90 mL/min (ref >90)
Potassium: 3.9 mEq/L (ref 3.5–5.1)
Sodium: 141 mEq/L (ref 135–145)

## 2013-02-01 MED ORDER — CEFUROXIME AXETIL 500 MG PO TABS: 500.0000 mg | ORAL_TABLET | Freq: Two times a day (BID) | ORAL | Status: DC

## 2013-02-01 MED ORDER — HYDROCODONE-ACETAMINOPHEN 5-325 MG PO TABS: 1.0000 | ORAL_TABLET | Freq: Four times a day (QID) | ORAL | Status: DC | PRN

## 2013-02-01 NOTE — ED Provider Notes (Signed)
Medical screening examination/treatment/procedure(s) were conducted as a shared visit with non-physician practitioner(s) and myself.  I personally evaluated the patient during the encounter  Pt stable in the ED.  Abdominal exam benign on my evaluation.    Joya Gaskins, MD 02/01/13 [phone]

## 2013-02-01 NOTE — Progress Notes (Signed)
[facility] PROGRESS NOTE  Brianna Castillo ZOX:[id number] DOB: [birth year] DOA: 01/30/2013 PCP: Pcp Not In System  HPI/Subjective: Has 4-5/10 back pain, denies any fever or chills.   Assessment/Plan:  Acute pyelonephritis/UTI -Patient started empirically on Rocephin, afebrile and WBC are trending down. -Antibiotic will be adjusted according to the culture result, culture pending.  Dehydration -Secondary to the concurrent infection, patient hydrated with IV fluids. -No evidence of dehydration biochemically now.  Cerebral palsy -Involving lower extremities, he had obvious deformity. -Patient is wheelchair-bound, she lives in assisted living facility.  Bipolar disorder -Patient is on lithium, as he continued throughout hospital stay.  Code Status: Full code Family Communication: Discussed with the patient Disposition Plan: Inpatient, likely discharge in a.m.   Consultants:  None  Procedures:  None  Antibiotics:  Rocephin started on 01/31/2013.   Objective: Filed Vitals:   01/31/13 0600 01/31/13 1431 01/31/13 2146 02/01/13 0557  BP: 127/80 111/74 119/78 138/74  Pulse: 103 87 78 80  Temp: 98.4 F (36.9 C) 98.9 F (37.2 C) 99.9 F (37.7 C) 98.9 F (37.2 C)  TempSrc: Oral Oral Oral Oral  Resp: 20 20 20 19   Height:      Weight:      SpO2: 93% 94% 94% 95%    Intake/Output Summary (Last 24 hours) at 02/01/13 1024 Last data filed at 01/31/13 2200  Gross per 24 hour  Intake    500 ml  Output      1 ml  Net    499 ml   Filed Weights   01/31/13 0145  Weight: 84.6 kg (186 lb 8.2 oz)    Exam:  General: Alert and awake, oriented x3, not in any acute distress. HEENT: anicteric sclera, pupils reactive to light and accommodation, EOMI CVS: S1-S2 clear, no murmur rubs or gallops Chest: clear to auscultation bilaterally, no wheezing, rales or rhonchi Abdomen: soft nontender, nondistended, normal bowel sounds, no organomegaly Extremities: no cyanosis,  clubbing or edema noted bilaterally, lower extremity with deformity Neuro: Cranial nerves II-XII intact, no focal neurological deficits   Data Reviewed: Basic Metabolic Panel:  Recent Labs Lab 01/27/13 1810 01/30/13 2150 02/01/13 0620  NA 141 141 141  K 3.7 4.5 3.9  CL 108 107 108  CO2 23 23 25   GLUCOSE 98 146* 86  BUN 7 8 7   CREATININE 0.55 0.51 0.55  CALCIUM 9.1 9.6 8.9   Liver Function Tests:  Recent Labs Lab 01/27/13 1810 01/30/13 2150  AST 16 24  ALT 18 28  ALKPHOS 112 115  BILITOT 0.1* <0.1*  PROT 7.2 7.3  ALBUMIN 3.7 3.7    Recent Labs Lab 01/27/13 1810 01/30/13 2150  LIPASE 22 19   No results found for this basename: AMMONIA,  in the last 168 hours CBC:  Recent Labs Lab 01/27/13 1810 01/30/13 2150 02/01/13 0620  WBC 14.0* 15.0* 11.1*  NEUTROABS 8.0* 11.1*  --   HGB 13.6 13.6 11.5*  HCT 42.4 40.5 34.8*  MCV 90.6 89.2 88.8  PLT 305 304 238   Cardiac Enzymes: No results found for this basename: CKTOTAL, CKMB, CKMBINDEX, TROPONINI,  in the last 168 hours BNP (last 3 results) No results found for this basename: PROBNP,  in the last 8760 hours CBG: No results found for this basename: GLUCAP,  in the last 168 hours  Recent Results (from the past 240 hour(s))  MRSA PCR SCREENING     Status: None   Collection Time    01/31/13  3:06 AM  Result Value Range Status   MRSA by PCR NEGATIVE  NEGATIVE Final   Comment:            The GeneXpert MRSA Assay (FDA     approved for NASAL specimens     only), is one component of a     comprehensive MRSA colonization     surveillance program. It is not     intended to diagnose MRSA     infection nor to guide or     monitor treatment for     MRSA infections.     Studies: No results found.  Scheduled Meds: . ARIPiprazole  4 mg Oral QHS  . cefTRIAXone (ROCEPHIN)  IV  1 g Intravenous Q24H  . cholecalciferol  1,000 Units Oral q morning - 10a  . enoxaparin (LOVENOX) injection  40 mg Subcutaneous Q24H   . gabapentin  100 mg Oral BID  . hydrocerin  1 application Topical QHS  . lithium carbonate  750 mg Oral QHS  . neomycin-polymyxin-hydrocortisone  2 drop Both Ears QID  . polyethylene glycol  17 g Oral Daily  . senna-docusate  3 tablet Oral BID  . tamsulosin  0.4 mg Oral QPC supper   Continuous Infusions: . sodium chloride 100 mL/hr at 02/01/13 0458    Principal Problem:   Pyelonephritis, acute Active Problems:   Dehydration   Cerebral palsy with spastic/ataxic diplegia   Bipolar 1 disorder    Time spent: 35 minutes    [facility]  [facility] Pager [phone]. If 7PM-7AM, please contact night-coverage at [email or web address], password [facility] 02/01/2013, 10:24 AM  LOS: 2 days

## 2013-02-02 NOTE — Discharge Summary (Signed)
Physician Discharge Summary  Brianna Castillo RUE:[id number] DOB: 02-03-1986 DOA: 01/30/2013  PCP: Pcp Not In System  Admit date: 01/30/2013 Discharge date: 02/02/2013  Time spent: 40 minutes  Recommendations for Outpatient Follow-up:  1. Followup with primary care physician one week  Discharge Diagnoses:  Principal Problem:   Pyelonephritis, acute Active Problems:   Dehydration   Cerebral palsy with spastic/ataxic diplegia   Bipolar 1 disorder   Discharge Condition: Stable  Diet recommendation: Regular  Filed Weights   01/31/13 0145  Weight: 84.6 kg (186 lb 8.2 oz)    History of present illness:  Brianna Castillo is an 27 y.o. female with history of cerebral palsy, asthma, bipolar disorder on lithium, presents to the emergency room from assisted living, having vomited once when she was at a bus stop. She admitted to having low back pain with nausea and vomiting. She also feels subjective fever. She denied abdominal cramps or pain, black stool bloody stool. Evaluation in emergency room showed a urinalysis with evidence of infection. She has a leukocytosis with white count 15.6 thousand, hemoglobin of 13.6 g per DL. Her renal function tests were normal, and her liver function tests are normal as well. She was given intravenous Rocephin. Hospitalist was asked to admit her for further treatment since she has nausea vomiting and is coming from an assisted living.  Hospital Course:   1. Acute pyelonephritis/UTI: As mentioned above patient came in with lower back pain with nausea and vomiting, urinalysis is consistent with UTI. Patient was started empirically on Rocephin. She has leukocytosis of 15,000 which trended down to 11 on the day prior to discharge. She is afebrile, the urine culture showed GBS, patient discharge on Ceftin. Patient to complete a total 14 days of antibiotics. The Ceftin is for 12 more days.  2. Dehydration: Secondary to the acute pyelonephritis, patient hydrated  aggressively with IV fluids.   3. Cerebral palsy: Involving her lower extremity, patient has obvious deformity, she is wheelchair-bound, she lives in assisted [facility] facility [facility]).  4. Bipolar disorder: Patient is on lithium, this was continued throughout the hospital stay without any changes.  Procedures:  None  Consultations:  None  Discharge Exam: Filed Vitals:   02/01/13 0557 02/01/13 1400 02/01/13 2100 02/02/13 0505  BP: 138/74 136/80 114/80 111/69  Pulse: 80 78 69 61  Temp: 98.9 F (37.2 C) 98.2 F (36.8 C) 97.4 F (36.3 C) 98.6 F (37 C)  TempSrc: Oral Oral Oral Oral  Resp: 19 18 20 20   Height:      Weight:      SpO2: 95% 99% 94% 96%   General: Alert and awake, oriented x3, not in any acute distress. HEENT: anicteric sclera, pupils reactive to light and accommodation, EOMI CVS: S1-S2 clear, no murmur rubs or gallops Chest: clear to auscultation bilaterally, no wheezing, rales or rhonchi Abdomen: soft nontender, nondistended, normal bowel sounds, no organomegaly Extremities: no cyanosis, clubbing or edema noted bilaterally Neuro: Cranial nerves II-XII intact, no focal neurological deficits  Discharge Instructions  Discharge Orders   Future Orders Complete By Expires     Increase activity slowly  As directed         Medication List    TAKE these medications       acetaminophen 325 MG tablet  Commonly known as:  TYLENOL  Take 650 mg by mouth every 4 (four) hours as needed for pain or fever.     albuterol 108 (90 BASE) MCG/ACT inhaler  Commonly known  as:  PROVENTIL HFA;VENTOLIN HFA  Inhale 1-2 puffs into the lungs every 6 (six) hours as needed for wheezing.     albuterol (2.5 MG/3ML) 0.083% nebulizer solution  Commonly known as:  PROVENTIL  Take 2.5 mg by nebulization every 4 (four) hours as needed (cough and wheezing).     ALPRAZolam 0.5 MG tablet  Commonly known as:  XANAX  Take 0.5 mg by mouth every 8 (eight) hours as needed for anxiety.      ARIPiprazole 2 MG tablet  Commonly known as:  ABILIFY  Take 4 mg by mouth at bedtime.     baclofen 10 MG tablet  Commonly known as:  LIORESAL  Take 10 mg by mouth 4 (four) times daily as needed (for spasms).     benzonatate 100 MG capsule  Commonly known as:  TESSALON  Take 100 mg by mouth 3 (three) times daily as needed (cough).     bisacodyl 10 MG suppository  Commonly known as:  DULCOLAX  Place 10 mg rectally daily as needed for constipation.     cefUROXime 500 MG tablet  Commonly known as:  CEFTIN  Take 1 tablet (500 mg total) by mouth 2 (two) times daily.     cholecalciferol 1000 UNITS tablet  Commonly known as:  VITAMIN D  Take 1,000 Units by mouth every morning.     gabapentin 100 MG capsule  Commonly known as:  NEURONTIN  Take 100 mg by mouth 2 (two) times daily.     Guaifenesin 1200 MG Tb12  Take 1 tablet (1,200 mg total) by mouth 2 (two) times daily at 10 AM and 5 PM.     HYDROcodone-acetaminophen 5-325 MG per tablet  Commonly known as:  NORCO/VICODIN  Take 1 tablet by mouth every 6 (six) hours as needed for pain.     hydrocortisone 2.5 % lotion  Apply 1 application topically 4 (four) times daily as needed (itching to lower back).     ibuprofen 100 MG/5ML suspension  Commonly known as:  ADVIL,MOTRIN  Take 200 mg by mouth every 6 (six) hours as needed for pain.     lithium carbonate 300 MG capsule  Take 600 mg by mouth at bedtime. Also takes 1 (150mg ) tablet together     lithium carbonate 150 MG capsule  Take 150 mg by mouth at bedtime. Also takes 2 (300mg ) tablets together     loratadine 10 MG tablet  Commonly known as:  CLARITIN  Take 10 mg by mouth daily as needed (post-nasal drip).     MIDOL MAX ST MENSTRUAL FORMULA PO  Take 2 tablets by mouth every 6 (six) hours as needed (for menstrual cramps).     Minerin Crea  Apply 1 application topically at bedtime.     neomycin-polymyxin-hydrocortisone otic solution  Commonly known as:  CORTISPORIN   Place 2 drops in ear(s) 4 (four) times daily.     ondansetron 4 MG tablet  Commonly known as:  ZOFRAN  Take 4 mg by mouth every 6 (six) hours as needed for nausea.     oxyCODONE-acetaminophen 5-325 MG per tablet  Commonly known as:  PERCOCET/ROXICET  Take 1 tablet by mouth every 6 (six) hours as needed (pain).     phenol 1.4 % Liqd  Commonly known as:  CHLORASEPTIC  Use as directed 2 sprays in the mouth or throat every 2 (two) hours as needed (for sore throat).     polyethylene glycol packet  Commonly known as:  MIRALAX / GLYCOLAX  Take 17 g by mouth daily.     senna-docusate 8.6-50 MG per tablet  Commonly known as:  Senokot-S  Take 3 tablets by mouth daily as needed for constipation.     sodium phosphate enema  Commonly known as:  FLEET  Place 1 enema rectally every three (3) days as needed (for constipation). follow package directions     tamsulosin 0.4 MG Caps  Commonly known as:  FLOMAX  Take 0.4 mg by mouth daily after supper.          The results of significant diagnostics from this hospitalization (including imaging, microbiology, ancillary and laboratory) are listed below for reference.    Significant Diagnostic Studies: Ct Angio Chest Pe W/cm &/or Wo Cm  01/27/2013  *RADIOLOGY REPORT*  Clinical Data: Short of breath, flank pain, concern pulmonary embolism  CT ANGIOGRAPHY CHEST  Technique:  Multidetector CT imaging of the chest using the standard protocol during bolus administration of intravenous contrast. Multiplanar reconstructed images including MIPs were obtained and reviewed to evaluate the vascular anatomy.  Contrast: OMNIPAQUE IOHEXOL 350 MG/ML SOLN  Comparison: None.  Findings: There are no filling defects within the pulmonary arteries to suggest acute pulmonary embolism.  No acute findings of the aorta or great vessels.  No acute findings of the aorta great vessels.  No pericardial fluid.  Esophagus is normal.  No mediastinal adenopathy.  No  supraclavicular or axillary adenopathy.  Limited view upper abdomen is unremarkable.  Limited view of the skeleton is unremarkable. There is a spinal stimulator electrode within the central canal of the thoracic spine.  Review of the lung parenchyma demonstrates ground-glass opacities and mild general there is septal thickening within the upper lobes and lower lobes.  This suggests a pulmonary edema pattern.  No focal consolidation.  IMPRESSION:  1.  No evidence of acute pulmonary embolism. 2.  Mild pulmonary edema pattern in the lungs.   Original Report Authenticated By: Genevive Bi, M.D.    Dg Abd Acute W/chest  01/27/2013  *RADIOLOGY REPORT*  Clinical Data: Shortness of breath.  Abdominal pain.  ACUTE ABDOMEN SERIES (ABDOMEN 2 VIEW & CHEST 1 VIEW)  Comparison: None.  Findings: There are low lung volumes.  The heart size and mediastinal contours are normal.  The lungs are clear.  There is no pleural effusion or pneumothorax.  Multiple telemetry leads overlie the chest.  There is an intrathecal catheter with an imbedded pump in the right abdominal wall.  The bowel gas pattern appears nonobstructive. There is mildly prominent stool within the right colon.  There is no free intraperitoneal air or suspicious calcification. Cholecystectomy clips are noted.  There are bilateral pelvic calcifications which are likely phleboliths.  Both hips demonstrate coxa valga deformities and femoral head irregularities suspicious for possible chronic osteonecrosis.  IMPRESSION: No acute cardiopulmonary or abdominal process identified.   Original Report Authenticated By: Carey Bullocks, M.D.     Microbiology: Recent Results (from the past 240 hour(s))  URINE CULTURE     Status: None   Collection Time    01/30/13 10:17 PM      Result Value Range Status   Specimen Description URINE, CLEAN CATCH   Final   Special Requests NONE   Final   Culture  Setup Time 01/31/2013 09:47   Final   Colony Count >=100,000 COLONIES/ML    Final   Culture     Final   Value: GROUP B STREP(S.AGALACTIAE)ISOLATED     Note: TESTING AGAINST S. AGALACTIAE NOT ROUTINELY  PERFORMED DUE TO PREDICTABILITY OF AMP/PEN/VAN SUSCEPTIBILITY.   Report Status 02/01/2013 FINAL   Final  MRSA PCR SCREENING     Status: None   Collection Time    01/31/13  3:06 AM      Result Value Range Status   MRSA by PCR NEGATIVE  NEGATIVE Final   Comment:            The GeneXpert MRSA Assay (FDA     approved for NASAL specimens     only), is one component of a     comprehensive MRSA colonization     surveillance program. It is not     intended to diagnose MRSA     infection nor to guide or     monitor treatment for     MRSA infections.     Labs: Basic Metabolic Panel:  Recent Labs Lab 01/27/13 1810 01/30/13 2150 02/01/13 0620  NA 141 141 141  K 3.7 4.5 3.9  CL 108 107 108  CO2 23 23 25   GLUCOSE 98 146* 86  BUN 7 8 7   CREATININE 0.55 0.51 0.55  CALCIUM 9.1 9.6 8.9   Liver Function Tests:  Recent Labs Lab 01/27/13 1810 01/30/13 2150  AST 16 24  ALT 18 28  ALKPHOS 112 115  BILITOT 0.1* <0.1*  PROT 7.2 7.3  ALBUMIN 3.7 3.7    Recent Labs Lab 01/27/13 1810 01/30/13 2150  LIPASE 22 19   No results found for this basename: AMMONIA,  in the last 168 hours CBC:  Recent Labs Lab 01/27/13 1810 01/30/13 2150 02/01/13 0620  WBC 14.0* 15.0* 11.1*  NEUTROABS 8.0* 11.1*  --   HGB 13.6 13.6 11.5*  HCT 42.4 40.5 34.8*  MCV 90.6 89.2 88.8  PLT 305 304 238   Cardiac Enzymes: No results found for this basename: CKTOTAL, CKMB, CKMBINDEX, TROPONINI,  in the last 168 hours BNP: BNP (last 3 results) No results found for this basename: PROBNP,  in the last 8760 hours CBG: No results found for this basename: GLUCAP,  in the last 168 hours     Signed:  ELMAHI,MUTAZ A  [facility] 02/02/2013, 9:18 AM

## 2013-02-02 NOTE — Clinical Social Work Note (Signed)
CSW received consult from RN. Patient lives at [facility] and needs to return today. Talked with Benard Rink, Manager at [facility], able to take patient back today. Patient to return via [facility]. Patient and facility aware of transfer. Discharge packet complete. Provided RN with number to call report. CSW signing off.  Ricke Hey, [place] [phone] (weekend)

## 2013-02-02 NOTE — Progress Notes (Signed)
NURSING PROGRESS NOTE  Brianna Castillo [id number] Discharge Data: 02/02/2013 2:20 PM Attending Provider: Clydia Llano, MD PCP:Pcp Not In System     Brianna Castillo to be D/C'd Brianna Castillo per MD order.  Pt parent's to transport to [facility] with instructions given to family. Report called to [facility].  All IV's discontinued with no bleeding noted. All belongings returned to patient for patient to take home.   Last Vital Signs:  Blood pressure 111/69, pulse 61, temperature 98.6 F (37 C), temperature source Oral, resp. rate 20, height 5\' 4"  (1.626 m), weight 84.6 kg (186 lb 8.2 oz), SpO2 96.00%.  Discharge Medication List   Medication List    TAKE these medications       acetaminophen 325 MG tablet  Commonly known as:  TYLENOL  Take 650 mg by mouth every 4 (four) hours as needed for pain or fever.     albuterol 108 (90 BASE) MCG/ACT inhaler  Commonly known as:  PROVENTIL HFA;VENTOLIN HFA  Inhale 1-2 puffs into the lungs every 6 (six) hours as needed for wheezing.     albuterol (2.5 MG/3ML) 0.083% nebulizer solution  Commonly known as:  PROVENTIL  Take 2.5 mg by nebulization every 4 (four) hours as needed (cough and wheezing).     ALPRAZolam 0.5 MG tablet  Commonly known as:  XANAX  Take 0.5 mg by mouth every 8 (eight) hours as needed for anxiety.     ARIPiprazole 2 MG tablet  Commonly known as:  ABILIFY  Take 4 mg by mouth at bedtime.     baclofen 10 MG tablet  Commonly known as:  LIORESAL  Take 10 mg by mouth 4 (four) times daily as needed (for spasms).     benzonatate 100 MG capsule  Commonly known as:  TESSALON  Take 100 mg by mouth 3 (three) times daily as needed (cough).     bisacodyl 10 MG suppository  Commonly known as:  DULCOLAX  Place 10 mg rectally daily as needed for constipation.     cefUROXime 500 MG tablet  Commonly known as:  CEFTIN  Take 1 tablet (500 mg total) by mouth 2 (two) times daily.     cholecalciferol 1000 UNITS tablet  Commonly  known as:  VITAMIN D  Take 1,000 Units by mouth every morning.     gabapentin 100 MG capsule  Commonly known as:  NEURONTIN  Take 100 mg by mouth 2 (two) times daily.     Guaifenesin 1200 MG Tb12  Take 1 tablet (1,200 mg total) by mouth 2 (two) times daily at 10 AM and 5 PM.     HYDROcodone-acetaminophen 5-325 MG per tablet  Commonly known as:  NORCO/VICODIN  Take 1 tablet by mouth every 6 (six) hours as needed for pain.     hydrocortisone 2.5 % lotion  Apply 1 application topically 4 (four) times daily as needed (itching to lower back).     ibuprofen 100 MG/5ML suspension  Commonly known as:  ADVIL,MOTRIN  Take 200 mg by mouth every 6 (six) hours as needed for pain.     lithium carbonate 300 MG capsule  Take 600 mg by mouth at bedtime. Also takes 1 (150mg ) tablet together     lithium carbonate 150 MG capsule  Take 150 mg by mouth at bedtime. Also takes 2 (300mg ) tablets together     loratadine 10 MG tablet  Commonly known as:  CLARITIN  Take 10 mg by mouth daily as needed (post-nasal  drip).     MIDOL MAX ST MENSTRUAL FORMULA PO  Take 2 tablets by mouth every 6 (six) hours as needed (for menstrual cramps).     Minerin Crea  Apply 1 application topically at bedtime.     neomycin-polymyxin-hydrocortisone otic solution  Commonly known as:  CORTISPORIN  Place 2 drops in ear(s) 4 (four) times daily.     ondansetron 4 MG tablet  Commonly known as:  ZOFRAN  Take 4 mg by mouth every 6 (six) hours as needed for nausea.     oxyCODONE-acetaminophen 5-325 MG per tablet  Commonly known as:  PERCOCET/ROXICET  Take 1 tablet by mouth every 6 (six) hours as needed (pain).     phenol 1.4 % Liqd  Commonly known as:  CHLORASEPTIC  Use as directed 2 sprays in the mouth or throat every 2 (two) hours as needed (for sore throat).     polyethylene glycol packet  Commonly known as:  MIRALAX / GLYCOLAX  Take 17 g by mouth daily.     senna-docusate 8.6-50 MG per tablet  Commonly known  as:  Senokot-S  Take 3 tablets by mouth daily as needed for constipation.     sodium phosphate enema  Commonly known as:  FLEET  Place 1 enema rectally every three (3) days as needed (for constipation). follow package directions     tamsulosin 0.4 MG Caps  Commonly known as:  FLOMAX  Take 0.4 mg by mouth daily after supper.        Rosalie Doctor, RN

## 2013-02-04 NOTE — Clinical Social Work Psychosocial (Signed)
Clinical Social Work Department BRIEF PSYCHOSOCIAL ASSESSMENT 02/04/2013  Patient:  Brianna Castillo, Brianna Castillo     Account Number:  [id number]     Admit date:  01/30/2013  Clinical Social Worker:  Johnsie Cancel  Date/Time:  02/04/2013 09:45 AM  Referred by:  Physician  Date Referred:  02/02/2013 Referred for  ALF Placement   Other Referral:   Interview type:  Patient Other interview type:    PSYCHOSOCIAL DATA Living Status:  FACILITY Admitted from facility:  [facility] Level of care:  Assisted Living Primary support name:  Clydie Braun [phone]) Primary support relationship to patient:  PARENT Degree of support available:   Unknown, not at patient's bedside.    CURRENT CONCERNS Current Concerns  Post-Acute Placement   Other Concerns:    SOCIAL WORK ASSESSMENT / PLAN CSW consulted by MD re: placement. CSW met with patient at bedside who stated she was from [facility] and wanted to return at d/c. Patient stated concern about d/c on the weekend due to [facility]. CSW stated she would contact [facility] and complete FL2 and fax to facility to approve for weekend CSW. Weekend CSW will facilitate d/c if facility accepts FL2.   Assessment/plan status:  Information/Referral to [facility] Other assessment/ plan:   Information/referral to community resources:    PATIENT'S/FAMILY'S RESPONSE TO PLAN OF CARE: Patient thanked CSW for sending Fl2 in advance. Patient thanked CSW for meeting at bedside.   Lia Foyer, LCSWA [facility] Clinical Social Worker Contact #: [phone]

## 2013-02-05 ENCOUNTER — Ambulatory Visit: Payer: Medicaid Other | Admitting: Psychiatry

## 2013-02-12 ENCOUNTER — Ambulatory Visit: Payer: Medicaid Other | Admitting: Psychiatry

## 2013-02-19 ENCOUNTER — Ambulatory Visit: Payer: Medicaid Other | Admitting: Psychiatry

## 2013-02-26 ENCOUNTER — Ambulatory Visit (INDEPENDENT_AMBULATORY_CARE_PROVIDER_SITE_OTHER): Payer: Medicaid Other | Admitting: Psychiatry

## 2013-02-26 DIAGNOSIS — F0630 Mood disorder due to known physiological condition, unspecified: Secondary | ICD-10-CM

## 2013-02-26 DIAGNOSIS — F319 Bipolar disorder, unspecified: Secondary | ICD-10-CM

## 2013-03-05 ENCOUNTER — Ambulatory Visit (INDEPENDENT_AMBULATORY_CARE_PROVIDER_SITE_OTHER): Payer: Medicaid Other | Admitting: Psychiatry

## 2013-03-05 DIAGNOSIS — F0630 Mood disorder due to known physiological condition, unspecified: Secondary | ICD-10-CM

## 2013-03-05 DIAGNOSIS — F319 Bipolar disorder, unspecified: Secondary | ICD-10-CM

## 2013-03-12 ENCOUNTER — Ambulatory Visit (INDEPENDENT_AMBULATORY_CARE_PROVIDER_SITE_OTHER): Payer: Medicaid Other | Admitting: Psychiatry

## 2013-03-12 DIAGNOSIS — F0630 Mood disorder due to known physiological condition, unspecified: Secondary | ICD-10-CM

## 2013-03-12 DIAGNOSIS — F319 Bipolar disorder, unspecified: Secondary | ICD-10-CM

## 2013-03-19 ENCOUNTER — Ambulatory Visit (INDEPENDENT_AMBULATORY_CARE_PROVIDER_SITE_OTHER): Payer: Medicaid Other | Admitting: Psychiatry

## 2013-03-19 DIAGNOSIS — F0630 Mood disorder due to known physiological condition, unspecified: Secondary | ICD-10-CM

## 2013-03-19 DIAGNOSIS — F319 Bipolar disorder, unspecified: Secondary | ICD-10-CM

## 2013-03-26 ENCOUNTER — Ambulatory Visit: Payer: Medicaid Other | Admitting: Psychiatry

## 2013-04-02 ENCOUNTER — Ambulatory Visit: Payer: Medicaid Other | Admitting: Psychiatry

## 2013-04-04 ENCOUNTER — Ambulatory Visit (INDEPENDENT_AMBULATORY_CARE_PROVIDER_SITE_OTHER): Payer: Medicaid Other | Admitting: Psychiatry

## 2013-04-04 DIAGNOSIS — F319 Bipolar disorder, unspecified: Secondary | ICD-10-CM

## 2013-04-04 DIAGNOSIS — F0630 Mood disorder due to known physiological condition, unspecified: Secondary | ICD-10-CM

## 2013-04-09 ENCOUNTER — Ambulatory Visit: Payer: Medicaid Other | Admitting: Psychiatry

## 2013-04-16 ENCOUNTER — Ambulatory Visit: Payer: Medicaid Other | Admitting: Psychiatry

## 2013-04-17 ENCOUNTER — Encounter (HOSPITAL_COMMUNITY): Payer: Self-pay | Admitting: *Deleted

## 2013-04-17 ENCOUNTER — Emergency Department (HOSPITAL_COMMUNITY)
Admission: EM | Admit: 2013-04-17 | Discharge: 2013-04-26 | Disposition: A | Discharge status: Home / Self Care | Payer: Medicaid Other | Attending: Emergency Medicine | Admitting: Emergency Medicine

## 2013-04-17 DIAGNOSIS — Z8669 Personal history of other diseases of the nervous system and sense organs: Secondary | ICD-10-CM | POA: Insufficient documentation

## 2013-04-17 DIAGNOSIS — Z8719 Personal history of other diseases of the digestive system: Secondary | ICD-10-CM | POA: Insufficient documentation

## 2013-04-17 DIAGNOSIS — Z79899 Other long term (current) drug therapy: Secondary | ICD-10-CM | POA: Insufficient documentation

## 2013-04-17 DIAGNOSIS — R443 Hallucinations, unspecified: Secondary | ICD-10-CM | POA: Insufficient documentation

## 2013-04-17 DIAGNOSIS — J45909 Unspecified asthma, uncomplicated: Secondary | ICD-10-CM | POA: Insufficient documentation

## 2013-04-17 DIAGNOSIS — F329 Major depressive disorder, single episode, unspecified: Secondary | ICD-10-CM

## 2013-04-17 DIAGNOSIS — F319 Bipolar disorder, unspecified: Secondary | ICD-10-CM | POA: Insufficient documentation

## 2013-04-17 DIAGNOSIS — R45851 Suicidal ideations: Secondary | ICD-10-CM | POA: Insufficient documentation

## 2013-04-17 DIAGNOSIS — Z3202 Encounter for pregnancy test, result negative: Secondary | ICD-10-CM | POA: Insufficient documentation

## 2013-04-17 LAB — CBC
MCH: 28.8 pg (ref 26.0–34.0)
MCHC: 32.4 g/dL (ref 30.0–36.0)
RDW: 13 % (ref 11.5–15.5)

## 2013-04-17 LAB — RAPID URINE DRUG SCREEN, HOSP PERFORMED
Amphetamines: NOT DETECTED
Benzodiazepines: NOT DETECTED
Opiates: NOT DETECTED
Tetrahydrocannabinol: NOT DETECTED

## 2013-04-17 LAB — COMPREHENSIVE METABOLIC PANEL
Albumin: 3.9 g/dL (ref 3.5–5.2)
Alkaline Phosphatase: 116 U/L (ref 39–117)
BUN: 9 mg/dL (ref 6–23)
Calcium: 9.4 mg/dL (ref 8.4–10.5)
GFR calc Af Amer: >90 mL/min (ref >90)
Potassium: 3.6 mEq/L (ref 3.5–5.1)
Sodium: 139 mEq/L (ref 135–145)
Total Protein: 7.1 g/dL (ref 6.0–8.3)

## 2013-04-17 LAB — ACETAMINOPHEN LEVEL: Acetaminophen (Tylenol), Serum: <15 ug/mL (ref 10–30)

## 2013-04-17 MED ORDER — ALUM & MAG HYDROXIDE-SIMETH 200-200-20 MG/5ML PO SUSP: 30.0000 mL | ORAL | Status: DC | PRN

## 2013-04-17 MED ORDER — NICOTINE 21 MG/24HR TD PT24
21.0000 mg | MEDICATED_PATCH | Freq: Every day | TRANSDERMAL | Status: DC
  Filled 2013-04-17 (×2): qty 1

## 2013-04-17 MED ORDER — IBUPROFEN 200 MG PO TABS
600.0000 mg | ORAL_TABLET | Freq: Three times a day (TID) | ORAL | Status: DC | PRN
  Administered 2013-04-17 – 2013-04-25 (×5): 600 mg via ORAL
  Filled 2013-04-17 (×5): qty 3

## 2013-04-17 MED ORDER — ONDANSETRON HCL 4 MG PO TABS
4.0000 mg | ORAL_TABLET | Freq: Three times a day (TID) | ORAL | Status: DC | PRN
  Administered 2013-04-23: 4 mg via ORAL
  Filled 2013-04-17: qty 1

## 2013-04-17 MED ORDER — ZOLPIDEM TARTRATE 5 MG PO TABS
5.0000 mg | ORAL_TABLET | Freq: Every evening | ORAL | Status: DC | PRN
  Administered 2013-04-18 – 2013-04-26 (×3): 5 mg via ORAL
  Filled 2013-04-17 (×3): qty 1

## 2013-04-17 MED ORDER — LORAZEPAM 1 MG PO TABS
1.0000 mg | ORAL_TABLET | Freq: Three times a day (TID) | ORAL | Status: DC | PRN
  Administered 2013-04-17 – 2013-04-18 (×2): 1 mg via ORAL
  Filled 2013-04-17 (×2): qty 1
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
## 2013-04-17 NOTE — ED Notes (Signed)
Pt states she is "overwhelmed by several things" right now. She states the recent death of a friend and upcoming move to [facility] make her feel "there is no place for her". Pt states she is hearing voices that tell her she does not fit any or belong anywhere. Pt does not have a plan but states she has had thoughts like "I can't be in my room right now because the call bell is really long". Pt attempted suicide 6 yrs ago.

## 2013-04-17 NOTE — ED Provider Notes (Signed)
History     CSN: [id number]  Arrival date & time 04/17/13  1532   First MD Initiated Contact with Patient 04/17/13 1542      Chief Complaint  Patient presents with  . Suicidal    (Consider location/radiation/quality/duration/timing/severity/associated sxs/prior treatment) HPI Comments: 27 year old female with past medical history of cerebral palsy, bipolar, depression and asthma presents to the emergency department with suicidal ideations. Patient lives at the [facility], and in her room that there is a very long call bell which she states she thought of hanging herself with over the past week. States she is "very overwhelmed with life" as she had a recent death of a friend and recently moved from [place]. She feels as if there is "no" his for her, and is a burden on everyone". Admits to auditory hallucinations telling her that she does not be long anywhere in this world. Admits to attempting suicide 6 years ago when she was admitted to a psych unit out in [place] for 4 months. Denies homicidal ideation. No recent changes in her psychiatric medications.  The history is provided by the patient.    Past Medical History  Diagnosis Date  . Asthma   . Bipolar 1 disorder   . Cerebral palsy with spastic/ataxic diplegia     spastic  . Pancreatitis     Past Surgical History  Procedure Laterality Date  . Appendectomy    . Cholecystectomy    . Major lower body reconstruction    . Eye surgery    . Baclofen pump refill      RLQ    No family history on file.  History  Substance Use Topics  . Smoking status: Never Smoker   . Smokeless tobacco: Not on file  . Alcohol Use: 0.6 oz/week    1 Shots of liquor per week     Comment: social drinker 1x  a moth     OB History   Grav Para Term Preterm Abortions TAB SAB Ect Mult Living   0 0 0 0 0 0 0 0 0 0       Review of Systems  Psychiatric/Behavioral: Positive for suicidal ideas and dysphoric mood.  All other systems reviewed and  are negative.    Allergies  Methadone hcl; Morphine and related; and Versed  Home Medications   Current Outpatient Rx  Name  Route  Sig  Dispense  Refill  . albuterol (PROVENTIL HFA;VENTOLIN HFA) 108 (90 BASE) MCG/ACT inhaler   Inhalation   Inhale 1-2 puffs into the lungs every 6 (six) hours as needed for wheezing.   1 Inhaler   0   . albuterol (PROVENTIL) (2.5 MG/3ML) 0.083% nebulizer solution   Nebulization   Take 2.5 mg by nebulization every 4 (four) hours as needed (cough and wheezing).         . ARIPiprazole (ABILIFY) 2 MG tablet   Oral   Take 4 mg by mouth at bedtime.          . cefUROXime (CEFTIN) 500 MG tablet   Oral   Take 1 tablet (500 mg total) by mouth 2 (two) times daily.   12 tablet   0   . cholecalciferol (VITAMIN D) 1000 UNITS tablet   Oral   Take 1,000 Units by mouth every morning.         . gabapentin (NEURONTIN) 100 MG capsule   Oral   Take 100 mg by mouth 2 (two) times daily.         Marland Kitchen  Guaifenesin 1200 MG TB12   Oral   Take 1 tablet (1,200 mg total) by mouth 2 (two) times daily at 10 AM and 5 PM.   14 each   0   . HYDROcodone-acetaminophen (NORCO/VICODIN) 5-325 MG per tablet   Oral   Take 1 tablet by mouth every 6 (six) hours as needed for pain.   20 tablet   0   . lithium carbonate 150 MG capsule   Oral   Take 150 mg by mouth at bedtime. Also takes 2 (300mg ) tablets together         . lithium carbonate 300 MG capsule   Oral   Take 600 mg by mouth at bedtime. Also takes 1 (150mg ) tablet together         . loratadine (CLARITIN) 10 MG tablet   Oral   Take 10 mg by mouth daily as needed (post-nasal drip).         Marland Kitchen neomycin-polymyxin-hydrocortisone (CORTISPORIN) otic solution   Otic   Place 2 drops in ear(s) 4 (four) times daily.         Marland Kitchen oxyCODONE-acetaminophen (PERCOCET/ROXICET) 5-325 MG per tablet   Oral   Take 1 tablet by mouth every 6 (six) hours as needed (pain).         . Skin Protectants, Misc.  (MINERIN) CREA   Apply externally   Apply 1 application topically at bedtime.         . tamsulosin (FLOMAX) 0.4 MG CAPS   Oral   Take 0.4 mg by mouth daily after supper.           There were no vitals taken for this visit.  Physical Exam  Nursing note and vitals reviewed. Constitutional: She is oriented to person, place, and time. She appears well-developed and well-nourished. No distress.  HENT:  Head: Normocephalic and atraumatic.  Mouth/Throat: Oropharynx is clear and moist.  Eyes: Conjunctivae and EOM are normal. Pupils are equal, round, and reactive to light.  Neck: Normal range of motion. Neck supple.  Cardiovascular: Normal rate, regular rhythm and normal heart sounds.   Pulmonary/Chest: Effort normal and breath sounds normal.  Abdominal: Soft. Bowel sounds are normal.  Musculoskeletal: She exhibits no edema and no tenderness.  Neurological: She is alert and oriented to person, place, and time.  Skin: Skin is warm and dry. She is not diaphoretic.  Psychiatric: Her speech is normal and behavior is normal. She exhibits a depressed mood. She expresses suicidal ideation. She expresses no homicidal ideation. She expresses suicidal plans.    ED Course  Procedures (including critical care time)  Labs Reviewed  CBC  COMPREHENSIVE METABOLIC PANEL  URINE RAPID DRUG SCREEN (HOSP PERFORMED)  ETHANOL  ACETAMINOPHEN LEVEL  SALICYLATE LEVEL   No results found.   1. Suicidal ideation       MDM  27 y/o female with SI and plan of hanging herself. Also with auditory hallucinations. Hx of same, previous psych admission 6 years ago. Consulting ACT team along with Dr. Shela Commons with psychiatry as he is in the ED today. 4:24 PM Spoke with Dr. Shela Commons who will have his NP evaluate patient. Patient medically cleared to move to psych ED. 7:09 PM Spoke with ACT team who will also evaluate patient.  Trevor Mace, PA-C 04/18/13 1643

## 2013-04-17 NOTE — ED Notes (Addendum)
Julaine Fusi, pt's mother, wants to be kept updated on pt: tel # [phone].

## 2013-04-18 ENCOUNTER — Ambulatory Visit: Payer: Medicaid Other | Admitting: Psychiatry

## 2013-04-18 ENCOUNTER — Encounter (HOSPITAL_COMMUNITY): Payer: Self-pay | Admitting: Registered Nurse

## 2013-04-18 DIAGNOSIS — R45851 Suicidal ideations: Secondary | ICD-10-CM

## 2013-04-18 DIAGNOSIS — F329 Major depressive disorder, single episode, unspecified: Secondary | ICD-10-CM

## 2013-04-18 MED ORDER — ALBUTEROL SULFATE HFA 108 (90 BASE) MCG/ACT IN AERS: 1.0000 | INHALATION_SPRAY | Freq: Four times a day (QID) | RESPIRATORY_TRACT | Status: DC | PRN

## 2013-04-18 MED ORDER — BETHANECHOL CHLORIDE 25 MG PO TABS
25.0000 mg | ORAL_TABLET | Freq: Three times a day (TID) | ORAL | Status: DC
  Administered 2013-04-18 – 2013-04-26 (×26): 25 mg via ORAL
  Filled 2013-04-18 (×32): qty 1

## 2013-04-18 MED ORDER — LITHIUM CARBONATE 150 MG PO CAPS
150.0000 mg | ORAL_CAPSULE | Freq: Every day | ORAL | Status: DC
  Administered 2013-04-18 – 2013-04-21 (×4): 150 mg via ORAL
  Filled 2013-04-18 (×5): qty 1

## 2013-04-18 MED ORDER — OXYCODONE-ACETAMINOPHEN 5-325 MG PO TABS
1.0000 | ORAL_TABLET | Freq: Four times a day (QID) | ORAL | Status: DC | PRN
  Administered 2013-04-23: 1 via ORAL
  Filled 2013-04-18: qty 1

## 2013-04-18 MED ORDER — ARIPIPRAZOLE 2 MG PO TABS
4.0000 mg | ORAL_TABLET | Freq: Every day | ORAL | Status: DC
  Administered 2013-04-18 – 2013-04-25 (×8): 4 mg via ORAL
  Filled 2013-04-18 (×10): qty 2

## 2013-04-18 MED ORDER — LORATADINE 10 MG PO TABS
10.0000 mg | ORAL_TABLET | Freq: Every day | ORAL | Status: DC | PRN
  Filled 2013-04-18: qty 1

## 2013-04-18 MED ORDER — TAMSULOSIN HCL 0.4 MG PO CAPS
0.4000 mg | ORAL_CAPSULE | Freq: Two times a day (BID) | ORAL | Status: DC
  Administered 2013-04-18 – 2013-04-26 (×17): 0.4 mg via ORAL
  Filled 2013-04-18 (×16): qty 1

## 2013-04-18 MED ORDER — PHENAZOPYRIDINE HCL 100 MG PO TABS
100.0000 mg | ORAL_TABLET | Freq: Three times a day (TID) | ORAL | Status: DC | PRN
  Administered 2013-04-20: 100 mg via ORAL
  Filled 2013-04-18: qty 1

## 2013-04-18 MED ORDER — GABAPENTIN 100 MG PO CAPS
100.0000 mg | ORAL_CAPSULE | Freq: Two times a day (BID) | ORAL | Status: DC
  Administered 2013-04-18 – 2013-04-26 (×17): 100 mg via ORAL
  Filled 2013-04-18 (×20): qty 1

## 2013-04-18 MED ORDER — LITHIUM CARBONATE 300 MG PO CAPS
600.0000 mg | ORAL_CAPSULE | Freq: Every day | ORAL | Status: DC
  Administered 2013-04-18 – 2013-04-21 (×4): 600 mg via ORAL
  Filled 2013-04-18 (×3): qty 2
  Filled 2013-04-18: qty 3
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
## 2013-04-18 NOTE — BH Assessment (Signed)
[facility] Assessment Progress Note      04/18/13.  0025. [facility] declines pt for admit due to total care. Daleen Squibb, LCSWA

## 2013-04-18 NOTE — ED Notes (Signed)
Spoke with Belenda Cruise, SW:  NP and psychiatrist will evaluate the patient today.  Belenda Cruise, SW will call Benard Rink or Dennie Bible at [employer] [phone]) to give them an update as to the plan of care.  Belenda Cruise will visit with patient today, as well.  Notified pt of this information as pt is concerned about the plan of care.

## 2013-04-18 NOTE — Progress Notes (Signed)
Patient declined at [facility] due to total care acuity. Patient referred to [facility]. Patient MCD is in [facility], therefore patient may be deffered from [facility] to [place]. ACT agreed to complete referral.   Patient [facility] planning to come and pick up patient motorized wheelchair tomorrow when someone is available to drive the wheelchair van.   Catha Gosselin, LCSWA  [phone] .04/18/2013 1636pm

## 2013-04-18 NOTE — [facility] Counselor (Signed)
Patient referred to [facility] pending acceptance to the waitlist. Oncoming staff will need to complete IVC and fax custody order to [facility]. [facility] authorization obtained # [id number].

## 2013-04-18 NOTE — Consult Note (Signed)
Reason for Consult:  Depression and SI thoughts Referring Physician: Trevor Mace, PA-C   Brianna Castillo is an 27 y.o. female.  HPI: Patient states that she has been having increased depression and SI thoughts since the death of 2 people whom she resides with in a group home.    "I have been going through a lot of stuff with the death of 2 well 3 friends if you go back 9 months.  Moving back to [place] in a short time and just the thoughts of moving everything and thinking about everybody. I started to feel like I was going crazy cause of having bad thoughts.  I went to [facility] to see somebody but missed the window for walk ins so I called and was told to go to the ED.  I feel like I just have a lot of time on my hands like now I think I could take this cord of call bell and wrap around my head and before somebody finds me I would be dead.  I can't contract for safety.  If I could contract for safety I would not be here."    When asked patient about previous attempts of SI patient states   "Yes.  When in hospital you know the trapeze bar the thing that hangs over the bed I tried to put it around my neck/head but couldn't.  I think God made me this way with these limitations because he knows what I would do if I was able; this way it makes it a little more difficult to carry out things; so I am grateful that it is difficult or I would have done it a long time ago."  Past Medical History  Diagnosis Date  . Asthma   . Bipolar 1 disorder   . Cerebral palsy with spastic/ataxic diplegia     spastic  . Pancreatitis     Past Surgical History  Procedure Laterality Date  . Appendectomy    . Cholecystectomy    . Major lower body reconstruction    . Eye surgery    . Baclofen pump refill      RLQ    History reviewed. No pertinent family history.  Social History:  reports that she has never smoked. She does not have any smokeless tobacco history on file. She reports that she drinks about  0.6 ounces of alcohol per week. Her drug history is not on file.  Allergies:  Allergies  Allergen Reactions  . Methadone Hcl   . Morphine And Related     hypoxic  . Versed (Midazolam)     Medications: I have reviewed the patient's current medications.  Results for orders placed during the hospital encounter of 04/17/13 (from the past 48 hour(s))  CBC     Status: Abnormal   Collection Time    04/17/13  4:20 PM      Result Value Range   WBC 16.2 (*) 4.0 - 10.5 K/uL   RBC 4.65  3.87 - 5.11 MIL/uL   Hemoglobin 13.4  12.0 - 15.0 g/dL   HCT 96.0  45.4 - 09.8 %   MCV 89.0  78.0 - 100.0 fL   MCH 28.8  26.0 - 34.0 pg   MCHC 32.4  30.0 - 36.0 g/dL   RDW 11.9  14.7 - 82.9 %   Platelets 340  150 - 400 K/uL  COMPREHENSIVE METABOLIC PANEL     Status: Abnormal   Collection Time    04/17/13  4:20  PM      Result Value Range   Sodium 139  135 - 145 mEq/L   Potassium 3.6  3.5 - 5.1 mEq/L   Chloride 106  96 - 112 mEq/L   CO2 19  19 - 32 mEq/L   Glucose, Bld 105 (*) 70 - 99 mg/dL   BUN 9  6 - 23 mg/dL   Creatinine, Ser 1.61  0.50 - 1.10 mg/dL   Calcium 9.4  8.4 - 09.6 mg/dL   Total Protein 7.1  6.0 - 8.3 g/dL   Albumin 3.9  3.5 - 5.2 g/dL   AST 11  0 - 37 U/L   ALT 10  0 - 35 U/L   Alkaline Phosphatase 116  39 - 117 U/L   Total Bilirubin 0.1 (*) 0.3 - 1.2 mg/dL   GFR calc non Af Amer >90  >90 mL/min   GFR calc Af Amer >90  >90 mL/min   Comment:            The eGFR has been calculated     using the CKD EPI equation.     This calculation has not been     validated in all clinical     situations.     eGFR's persistently     <90 mL/min signify     possible Chronic Kidney Disease.  ETHANOL     Status: None   Collection Time    04/17/13  4:20 PM      Result Value Range   Alcohol, Ethyl (B) <11  0 - 11 mg/dL   Comment:            LOWEST DETECTABLE LIMIT FOR     SERUM ALCOHOL IS 11 mg/dL     FOR MEDICAL PURPOSES ONLY  ACETAMINOPHEN LEVEL     Status: None   Collection Time     04/17/13  4:20 PM      Result Value Range   Acetaminophen (Tylenol), Serum <15.0  10 - 30 ug/mL   Comment:            THERAPEUTIC CONCENTRATIONS VARY     SIGNIFICANTLY. A RANGE OF 10-30     ug/mL MAY BE AN EFFECTIVE     CONCENTRATION FOR MANY PATIENTS.     HOWEVER, SOME ARE BEST TREATED     AT CONCENTRATIONS OUTSIDE THIS     RANGE.     ACETAMINOPHEN CONCENTRATIONS     >150 ug/mL AT 4 HOURS AFTER     INGESTION AND >50 ug/mL AT 12     HOURS AFTER INGESTION ARE     OFTEN ASSOCIATED WITH TOXIC     REACTIONS.  SALICYLATE LEVEL     Status: Abnormal   Collection Time    04/17/13  4:20 PM      Result Value Range   Salicylate Lvl <2.0 (*) 2.8 - 20.0 mg/dL  URINE RAPID DRUG SCREEN (HOSP PERFORMED)     Status: None   Collection Time    04/17/13  4:26 PM      Result Value Range   Opiates NONE DETECTED  NONE DETECTED   Cocaine NONE DETECTED  NONE DETECTED   Benzodiazepines NONE DETECTED  NONE DETECTED   Amphetamines NONE DETECTED  NONE DETECTED   Tetrahydrocannabinol NONE DETECTED  NONE DETECTED   Barbiturates NONE DETECTED  NONE DETECTED   Comment:            DRUG SCREEN FOR MEDICAL PURPOSES     ONLY.  IF CONFIRMATION IS NEEDED     FOR ANY PURPOSE, NOTIFY LAB     WITHIN 5 DAYS.                LOWEST DETECTABLE LIMITS     FOR URINE DRUG SCREEN     Drug Class       Cutoff (ng/mL)     Amphetamine      1000     Barbiturate      200     Benzodiazepine   200     Tricyclics       300     Opiates          300     Cocaine          300     THC              50  POCT PREGNANCY, URINE     Status: None   Collection Time    04/17/13  4:28 PM      Result Value Range   Preg Test, Ur NEGATIVE  NEGATIVE   Comment:            THE SENSITIVITY OF THIS     METHODOLOGY IS >24 mIU/mL    No results found.  Review of Systems  Constitutional: Negative.   HENT: Negative for hearing loss, ear pain, nosebleeds, congestion, sore throat, tinnitus and ear discharge.   Eyes: Negative.        Patient  states that she wears glasses for distant vision  Respiratory: Negative.  Negative for stridor.   Cardiovascular: Negative.   Genitourinary:       Patient states that she is incont of urine and at times she may have to catheterized to urinate.  Musculoskeletal:       Cerebral palsy.  Patient states that she uses a w/c no ambulation and decrease ROM orf lower ext. Bilaterally.  Patient states that she is able to move her upper ext bilaterally with out difficulty.    Skin: Negative.   Neurological: Positive for headaches (Patient states that she has started to have frequent headache relieved  with medication).  Psychiatric/Behavioral: Positive for depression and suicidal ideas. Negative for hallucinations and memory loss. Substance abuse: History.  Last use THC 6 yrs ago. The patient is nervous/anxious. The patient does not have insomnia.    Blood pressure 120/73, pulse 96, temperature 98.9 F (37.2 C), temperature source Oral, resp. rate 20, SpO2 95.00%. Physical Exam  Constitutional: She is oriented to person, place, and time. She appears well-developed and well-nourished.  HENT:  Head: Normocephalic and atraumatic.  Eyes: Conjunctivae are normal. Pupils are equal, round, and reactive to light.  Neck: Normal range of motion. Neck supple.  Cardiovascular: Normal rate, regular rhythm and normal heart sounds.   Respiratory: Effort normal and breath sounds normal.  GI: Soft. Bowel sounds are normal. There is no tenderness.  Musculoskeletal:  Decrease ROM lower ext  Lymphadenopathy:    She has no cervical adenopathy.  Neurological: She is alert and oriented to person, place, and time.  Psychiatric: Her speech is normal and behavior is normal. Her mood appears anxious. Her affect is blunt. She exhibits a depressed mood. She expresses suicidal ideation. She expresses suicidal plans.    Assessment/Plan: Recommendation inpatient treatment.  Courtnay Petrilla B. Georgiann Neider FNP-BC Family Nurse Practitioner,  Board Certified  Romell Cavanah 04/18/2013, 1:37 PM

## 2013-04-19 LAB — CBC WITH DIFFERENTIAL/PLATELET
Eosinophils Absolute: 0.4 10*3/uL (ref 0.0–0.7)
HCT: 41.8 % (ref 36.0–46.0)
Hemoglobin: 13.7 g/dL (ref 12.0–15.0)
Lymphs Abs: 3.4 10*3/uL (ref 0.7–4.0)
MCH: 29.5 pg (ref 26.0–34.0)
MCHC: 32.8 g/dL (ref 30.0–36.0)
MCV: 90.1 fL (ref 78.0–100.0)
Monocytes Absolute: 0.9 10*3/uL (ref 0.1–1.0)
Monocytes Relative: 6 % (ref 3–12)
Neutrophils Relative %: 66 % (ref 43–77)
RBC: 4.64 MIL/uL (ref 3.87–5.11)

## 2013-04-19 LAB — URINALYSIS, ROUTINE W REFLEX MICROSCOPIC
Glucose, UA: NEGATIVE mg/dL
Nitrite: NEGATIVE
Specific Gravity, Urine: 1.016 (ref 1.005–1.030)
pH: 7 (ref 5.0–8.0)

## 2013-04-19 LAB — COMPREHENSIVE METABOLIC PANEL
Alkaline Phosphatase: 110 U/L (ref 39–117)
BUN: 12 mg/dL (ref 6–23)
Chloride: 105 mEq/L (ref 96–112)
Creatinine, Ser: 0.53 mg/dL (ref 0.50–1.10)
GFR calc Af Amer: >90 mL/min (ref >90)
Glucose, Bld: 110 mg/dL — ABNORMAL HIGH (ref 70–99)
Potassium: 4.3 mEq/L (ref 3.5–5.1)
Total Bilirubin: 0.3 mg/dL (ref 0.3–1.2)
Total Protein: 6.9 g/dL (ref 6.0–8.3)

## 2013-04-19 LAB — URINE MICROSCOPIC-ADD ON

## 2013-04-19 LAB — LIPASE, BLOOD: Lipase: 26 U/L (ref 11–59)

## 2013-04-19 MED ORDER — MAGNESIUM CITRATE PO SOLN
1.0000 | Freq: Once | ORAL | Status: AC
  Administered 2013-04-19: 1 via ORAL
  Filled 2013-04-19: qty 296

## 2013-04-19 MED ORDER — SODIUM CHLORIDE 0.9 % IV BOLUS (SEPSIS)
1000.0000 mL | Freq: Once | INTRAVENOUS | Status: AC
  Administered 2013-04-19: 1000 mL via INTRAVENOUS

## 2013-04-19 NOTE — Progress Notes (Signed)
CSW met with pt at bedside. Patient had requested her wheelchair to be picked up to be in a safe place. Patient continuing to endorse SI. Patient expressed concern regarding her baclofen pump, stating that she will need a refil by June 4th. CSW will follow up with pt placement and rn cm regaring baclofen pump once placement has been determined.   Catha Gosselin, LCSWA  [phone] .04/19/2013 1407pm

## 2013-04-19 NOTE — ED Notes (Signed)
Brianna Castillo from [facility] where the pt resides came and got wheelchair. States he was told to pick it up bc it was "in the way".

## 2013-04-19 NOTE — ED Provider Notes (Signed)
Medical screening examination/treatment/procedure(s) were conducted as a shared visit with non-physician practitioner(s) and myself.  I personally evaluated the patient during the encounter  The patient will be seen and evaluated by psychiatry as well as by the ACT team for placement. SI  Lyanne Co, MD 04/19/13 2352

## 2013-04-19 NOTE — ED Provider Notes (Signed)
Patient presents to ED for suicidal thoughts. She has cerebral palsy. On review of her vital signs her blood pressure initially declined over the past few hours. Urinalysis was never obtained. Suspect neurogenic bladder. She has a leukocytosis.  BP 99/54  Pulse 81  Temp(Src) 98 F (36.7 C) (Oral)  Resp 18  SpO2 98%  Patient's urinalysis is not impressive for infection. Her repeat labs are remarkable for a subtherapeutic lithium level. Her white count has decreased at 13. Her blood pressures have improved to the 100s to 110s systolic. Her heart rate is in the 80s. BP 102/69  Pulse 82  Temp(Src) 98 F (36.7 C) (Oral)  Resp 18  SpO2 98%   Glynn Octave, MD 04/19/13 1531

## 2013-04-20 NOTE — ED Notes (Signed)
Bed:WA07<BR> Expected date:<BR> Expected time:<BR> Means of arrival:<BR> Comments:<BR>

## 2013-04-20 NOTE — ED Provider Notes (Signed)
Pt stable awaiting placement  Benny Lennert, MD 04/20/13 [phone]

## 2013-04-20 NOTE — BH Assessment (Signed)
BHH Assessment Progress Note     Pt accepted to [facility], per Okey Regal at 0940 on 04-20-13 to their WAIT LIST.  [facility] will contact ACT when pt is ready to be transported once off the WAIT LIST

## 2013-04-21 LAB — URINE CULTURE: Colony Count: 90000

## 2013-04-21 MED ORDER — CEPHALEXIN 500 MG PO CAPS
500.0000 mg | ORAL_CAPSULE | Freq: Four times a day (QID) | ORAL | Status: DC
  Administered 2013-04-21 – 2013-04-26 (×22): 500 mg via ORAL
  Filled 2013-04-21 (×21): qty 1

## 2013-04-21 NOTE — ED Notes (Signed)
She is calm and cooperative in the presence of her sitter.  She is pleased with her care.

## 2013-04-21 NOTE — ED Notes (Signed)
Pt and family expressing concerns regarding lithium level.  Per prior RN, MD is aware and is not changing dose at this time.

## 2013-04-21 NOTE — ED Notes (Addendum)
Patient alert and oriented and pleasant. No complaints. Will initiate Keflex at 12 noon. Patient voided and had a BM this morning.

## 2013-04-22 MED ORDER — LITHIUM CARBONATE 300 MG PO CAPS
900.0000 mg | ORAL_CAPSULE | Freq: Every day | ORAL | Status: DC
  Administered 2013-04-22 – 2013-04-25 (×4): 900 mg via ORAL
  Filled 2013-04-22 (×4): qty 3

## 2013-04-22 NOTE — ED Notes (Addendum)
Received a call from Vangie Bicker, who states he is the medical POA for patient. He gave his telephone number as [phone]. John voices concern that patient's Baclofen pump is scheduled to run out on June 4th. Caller states that he is concerned about the patient being transferred to [facility] without being seen for a refill by Dr. Heywood Footman at the [facility], [phone]. Instructed caller that the patient was in the ED setting and at this time is unable to leave our facility and is being provided medical care. CSW spoke with pt Medical POA and confirmed plan to address pt Baclofen pump. CSW left message with Dr. Heywood Footman at [phone] which is provider's personal cell number.   Catha Gosselin, LCSWA  [phone] .04/22/2013 1417pm

## 2013-04-22 NOTE — Progress Notes (Addendum)
Addendum: CSW spoke with Dr. Heywood Footman regarding Baclofen pump refill. Dr. Sherral Hammers stated that he [facility] [address] [place], [place]. Dr. Sherral Hammers stated that if we can transport patient to his office, patient can receive refill within 15 to 20 min. And return to [facility] and await placement. Dr. Sherral Hammers states that he unfortunately does not have privileges at [facility] to administer refill in our ED. CSW plans to discuss further tomorrow with Dr. Sherral Hammers as he will be in the office. Patient still pending [facility] waitlist.  CSW and NP discussed patient Baclofen pump. CSW discussed with patient on Friday 04/19/2013 and is planning to discuss with RN CM. CSW will follow up with treatment team regarding patient baclofen pump.     Catha Gosselin, LCSWA  [phone] 04/22/2013 1543pm

## 2013-04-22 NOTE — ED Notes (Signed)
Pt alert. Respirations even and unlabored. Bilateral rise and fall of chest. Skin warm and dry. In no acute distress. Denies needs.  

## 2013-04-22 NOTE — BHH Counselor (Signed)
Barbara at [facility] confirms pt still on wait list.  Evette Cristal, [place] Assessment Counselor

## 2013-04-22 NOTE — BHH Counselor (Signed)
Confirmed that patient is on the wait list, per Coralee North 04-22-13 @ 1515.

## 2013-04-22 NOTE — Consult Note (Signed)
Reason for Consult: depression and suicidal ideations Referring Physician: ERP  Brianna Castillo is an 27 y.o. female.  HPI: This is chart reviewed patient continued to be depressed and suicidal ideations without any specific plan. Patient requested to adjust medications lithium as it is subtherapeutic. Patient cannot contract for safety even though she wants to go to the facility in [place], [place]  MSE: Patient was seen and sitter is at bed side. She continues to be endorsing suicidal ideation and plans. She feels safe in emergency because he knows sitter is watching her.  Past Medical History  Diagnosis Date  . Asthma   . Bipolar 1 disorder   . Cerebral palsy with spastic/ataxic diplegia     spastic  . Pancreatitis     Past Surgical History  Procedure Laterality Date  . Appendectomy    . Cholecystectomy    . Major lower body reconstruction    . Eye surgery    . Baclofen pump refill      RLQ    History reviewed. No pertinent family history.  Social History:  reports that she has never smoked. She does not have any smokeless tobacco history on file. She reports that she drinks about 0.6 ounces of alcohol per week. Her drug history is not on file.  Allergies:  Allergies  Allergen Reactions  . Methadone Hcl   . Morphine And Related     hypoxic  . Versed (Midazolam)     Medications: I have reviewed the patient's current medications.  No results found for this or any previous visit (from the past 48 hour(s)).  No results found.  Positive for anxiety, bad mood, bipolar, depression, mood swings and suicidal and can not contract for safety. Blood pressure 147/81, pulse 90, temperature 98.7 F (37.1 C), temperature source Oral, resp. rate 20, SpO2 96.00%.   Assessment/Plan: Bipolar disorder, MRE depression with suicidal ideation Cerebral Palsy   Recommendation:  1. Increase Lithium 900 mg PO Qhs (Lithium level is sub therapeutic) 2. Wait for [facility] wait list for  inpatient treatment 3. Continue Sitter  Brianna Castillo,JANARDHAHA R. 04/22/2013, 11:21 AM

## 2013-04-23 ENCOUNTER — Ambulatory Visit: Payer: Medicaid Other | Admitting: Psychiatry

## 2013-04-23 NOTE — Progress Notes (Signed)
[facility] ED CM consulted by ED SW and ED nursing director about need for assistance with refill of balcofen pump by May 01 2013 CM reviewed EPIC notes to find Heywood Footman, PA, [facility] at [address] [place], [place]. At 1152 Dr. Sherral Hammers stated that if we can transport patient to his office, patient can receive refill within 15 to 20 min. CM spoke with CM leadership about Sherral Hammers privilege or credentialing to see pt in [facility] ED.  Referred to Medical staff services.  Sherral Hammers called and he agreed to see pt at [facility] if privileges allowed.   1159 Pt's mother called to updated her that Cm was assisting to get pt to her appointment to get balcofen refill ([phone]) [address] in Medical staff services [phone] fax [phone]) referred CM to Psychiatrist/EDP to consult triad hospitalist/neurologist Credentialing for Sherral Hammers would take 4-6 weeks even though he has his own assistant and medication to assist pt  1205 Cm spoke with the pt who showed CM the RLQ site for her balcofen pump Reports having this pump for 8 years and is in agreement with either having Sherral Hammers see her in [facility] ED or going to see him in his office (process takes 15- 20 minutes) prior to going to [facility] 1208 A return call form [facility] inpatient pharmacy confirms Amedeo Plenty is a specialty medication not available at this time  Confirmed with Heywood Footman, PA [phone]) that pt can be seen at the [facility] office on Apr 26 2013 at Willaim Bane, PA report he [place]  EDP, Denton Lank and ED SW [place] aware. ED SW updated pt  1224 Message left for mother to updated her about pt appointment on Friday [phone]

## 2013-04-23 NOTE — Progress Notes (Signed)
Per ann pt is still on wait list at [facility] with no anticipated dc at this time.   Catha Gosselin, LCSWA  [phone] .04/23/2013 1611pm

## 2013-04-24 ENCOUNTER — Encounter (HOSPITAL_COMMUNITY): Payer: Self-pay | Admitting: Registered Nurse

## 2013-04-24 NOTE — Progress Notes (Addendum)
CSW met with pt at bedside. Patient and CSW discussed patient current treatment plan. Patient continued to endorse SI with plan of rolling into traffic. Patient states, 'I feel safe here. But I dont feel I would would feel safe if I left here." ." Patient states, she is starting to feel some what better, however still has the thoughts of wanting to roll her motorized wheelchair into traffic. Patient states she is overwhelmed by the independence at the Assisted Living and may want have thougths to hurt herself and not enough support to protect herself. Patient and CSW discussed possible resources and patient states, "I'm just not convinced having to call a mobile crisis number would be enough." Patient states that her group home will be able to provide more one on one, and have an act team in [place] once she is transferred. Pt gave permission to speak with pt parents and case worker Boykin Nearing. CSW left message for patient case worker Boykin Nearing at [phone] ext [phone] regarding patient plan at Group home in [place].   Addendum: CSW plans to discuss with Boykin Nearing pt needs, and resources available in [place] to assist with pt disposition plan.   Catha Gosselin, LCSWA  [phone] .04/24/2013 1229pm

## 2013-04-24 NOTE — ED Provider Notes (Signed)
7:55 AM Filed Vitals:   04/24/13 0613  BP: 93/53  Pulse: 77  Temp: 98.6 F (37 C)  Resp: 20   Awaiting placement. No complaints this AM  Lyanne Co, MD 04/24/13 [phone]

## 2013-04-24 NOTE — Consult Note (Signed)
Reviewed the information documented and agree with the treatment plan.  Andrzej Scully,JANARDHAHA R. 04/24/2013 6:02 PM 

## 2013-04-24 NOTE — Consult Note (Signed)
Reason for Consult:  Follow up previous consult SI Referring Physician: EDP  Brianna Castillo is an 27 y.o. female.  HPI: Patient states that she does not feel safe and is unable to contract for safety.  "I still don't trust my self.  If I go to the group home I will get my wheel chair back and will be able to do bad things; that makes it scary.  Here I don't have access ya'll take away all of the things that I could use to hurt my self and that helps.    Past Medical History  Diagnosis Date  . Asthma   . Bipolar 1 disorder   . Cerebral palsy with spastic/ataxic diplegia     spastic  . Pancreatitis     Past Surgical History  Procedure Laterality Date  . Appendectomy    . Cholecystectomy    . Major lower body reconstruction    . Eye surgery    . Baclofen pump refill      RLQ    History reviewed. No pertinent family history.  Social History:  reports that she has never smoked. She does not have any smokeless tobacco history on file. She reports that she drinks about 0.6 ounces of alcohol per week. Her drug history is not on file.  Allergies:  Allergies  Allergen Reactions  . Methadone Hcl   . Morphine And Related     hypoxic  . Versed (Midazolam)     Medications: I have reviewed the patient's current medications.  No results found for this or any previous visit (from the past 48 hour(s)).  No results found.  Review of Systems  Constitutional: Negative.   HENT: Negative for hearing loss, ear pain, nosebleeds, congestion, sore throat, tinnitus and ear discharge.   Eyes: Negative.        Patient states that she wears glasses for distant vision  Respiratory: Negative.  Negative for stridor.   Cardiovascular: Negative.   Genitourinary:       Patient states that she is incont of urine and at times she may have to catheterized to urinate.  Musculoskeletal:       Cerebral palsy.  Patient states that she uses a w/c no ambulation and decrease ROM orf lower ext. Bilaterally.   Patient states that she is able to move her upper ext bilaterally with out difficulty.    Skin: Negative.   Neurological: Positive for headaches (Patient states that she has started to have frequent headache relieved  with medication).  Psychiatric/Behavioral: Positive for depression and suicidal ideas. Negative for hallucinations and memory loss. Substance abuse: History.  Last use THC 6 yrs ago. The patient is nervous/anxious. The patient does not have insomnia.    Blood pressure 93/53, pulse 77, temperature 98.6 F (37 C), temperature source Oral, resp. rate 20, SpO2 96.00%. Physical Exam  Constitutional: She is oriented to person, place, and time.  HENT:  Head: Normocephalic.  Mouth/Throat: Oropharynx is clear and moist.  Eyes: Conjunctivae are normal. Pupils are equal, round, and reactive to light.  Neck: Normal range of motion.  Cardiovascular: Normal rate.   Respiratory: Effort normal.  Neurological: She is alert and oriented to person, place, and time.    Assessment/Plan: Recommendation:  Continue with current plan of treatment for placement at [facility]; Also continue to look for placement elsewhere if bed available sooner.  If patient is able to contract for safety with intensive out patient resources there may be a possibility  for  discharge back to nursing facility  Karina Lenderman B. Bernadean Saling FNP-BC Family Nurse Practitioner, Board Certified  Ezriel Boffa 04/24/2013, 4:27 PM

## 2013-04-25 NOTE — Progress Notes (Signed)
ED CM received a call from Dr Jacky Kindle about d/c plans Updated ED SW

## 2013-04-25 NOTE — Progress Notes (Signed)
Late entry for 04/25/13 0945 Updated ED nursing director on plan of care

## 2013-04-25 NOTE — Progress Notes (Signed)
CSW met with patient at bedside to assess patient needs. Patient denies SI/HI/AH/VH. Patient states, "I'm feeling a lot better today, today is a good day". Patient is smiling and able to joke with this Clinical research associate. Patient states she is able to contract for safety to return to [facility] after appointment at the doctors office, if patient continues to feel hopeful tomorrow.   Patient and CSW discussed concerns about returning to [facility]. Patient and CSW discussed utilizing the mobile crisis line in times of crisis. Patient states, "I hope that I won't have to use them but I'm glad I can have them as insurance if it is not a Tuesday psychiatry day." Pt shared she is looking forward to moving to her group home at [place] on 05/13/2013. Patient states she is looking forward to returning to her church group, her classes, and doing supportive work when she returns to [place]. Pt states, "I get frustrated and then I get tripped up on things that won't matter next week. Patient states, "I had a wake up call last night, I was sitting her last night and a homeless man walked by, and I thoguht, he probably hasn't had anything to eat since I've been here since the 21st, I have a lot to be thankful for and to give me hope." Pt and CSW discussed the things the patient currently has, the support at the assisted living, her friends at the alf, her friends at church including Mellody Dance, and her supportive mother and step father. Patient also stated, I have food, shelter, and activities to look forward to.   Patient and csw discussed possible safety plans for patient. Patient states she can contract for safety and return to her assisted living after her Dr. Astronomer as long as she continues to have this hope, as today is her first day of feeling better. Patient and csw discussed patients concerns. Patient expressed that she still doesn't feel she should be by herself in the community in her wheelchair. Patient and CSW  discussed always being with a staff member or the group when leaving the ALF. Patient states, "I can do that." Patient stated, "Its only two weeks of having to be in the regimated schedule of [facility] before I can make my own schedule with my one one one support going to classes and in the community in [place]. Patient has plans to have community act in [place], as well as IDD assistance. Patient states, "With mobile crisis, and the support from the Assisted living, I think I can make it [facility] for 2 weeks, before moving to the group home 6/16. Patient is also looking forward to spending the planned father's day visit at home with her mother and step father.   Patients states, "I was feeling better today, that I thought I should be able to go to the sleep study, so I scheduled an appointment for 6/5."  Patient stated, I have a hard time expressing my frustration, and smoetimes it makes me just want to leave. CSW and pt discussed those frustrations including conflict with her ex boyfriend at the ALF, frustrations with staff who make comments about having to provide care that are sometimes hurtful, and frustration that everything in pt life is observed, recorded, and discussed. Patient and CSW discussed coping skills in order to deal with this frustration. Patient states that she has friends that she trust that she can express this frustration to get it out. Patient identified a coping skill that she uses  with her psychologist of sorting out the things that make [place] frustrated that matter now, will matter in a week, or will matter in a year. Patient states that once I realize its not going to matter after the moment, I am able to rationalize more. Patient stated, I was frustrated and upset over singing in my friend's funeral, but now that i've been so upset, it doesn't even matter because I wasn't able to be there because my frustration put me in the hospital. Patient stated, "I want to be able to make  sure I never get this frustrated and upset, to where I loose hope, and have to come back to the hospital."   CSW informed NP regarding assessment.   Catha Gosselin, LCSWA  [phone] 04/25/2013 1613pm

## 2013-04-25 NOTE — Progress Notes (Signed)
ED CM spoke with Marchelle Folks at [facility] at [address] [place], [place] to confirm pt has an appointment on Apr 26 2013 at 11 am not and there is not an appointment in June 2014  ED SW updated

## 2013-04-26 DIAGNOSIS — F319 Bipolar disorder, unspecified: Secondary | ICD-10-CM

## 2013-04-26 MED ORDER — LITHIUM CARBONATE 300 MG PO CAPS: 900.0000 mg | ORAL_CAPSULE | Freq: Every day | ORAL | Status: DC

## 2013-04-26 NOTE — Progress Notes (Signed)
CSW spoke with Francena Hanly at [facility] and reuqesting FL2 as directed by DSS case worker. CSW discussed with Interior and spatial designer. CSW completed FL2 and reviewed with MD with pt medications list sent from ALF. CSW faxed to [facility].   Marland KitchenCatha Gosselin, Theresia Majors  [phone] .04/26/2013 1727pm

## 2013-04-26 NOTE — ED Provider Notes (Signed)
Filed Vitals:   04/26/13 0021  BP: 112/70  Pulse:   Temp: 98.4 F (36.9 C)  Resp: 18   Pt sleeping this am.  Awaiting placement.  Celene Kras, MD 04/26/13 [phone]

## 2013-04-26 NOTE — Progress Notes (Signed)
ED CM spoke with Brianna Castillo at [facility] to scheduled appointment transportation to [address] [place], [place] from [place] ED Rm #7 ED psychiatrist/NP, ED RN, Tim and ED SW aware. Pending further ED psychiatrist evaluation. ED RN states pt doing well this am.

## 2013-04-26 NOTE — Progress Notes (Signed)
ED CM updated ED RN, Tim on d/c plans- change in Epilepsy appointment to April 29 2013 and pending d/c to [facility] [employer]

## 2013-04-26 NOTE — ED Notes (Signed)
Meal tray given 

## 2013-04-26 NOTE — Progress Notes (Signed)
CSW met with pt and psychiatrist at bedside. Patient denies SI/HI/AH/VH. Patient plans to return home to [facility]. Patient is able to contract for safety. Patient plans to limit her independence at [facility] until she follows up with her outpatient providers. Pt safety plan includes having a friend or buddy with her out in the community and if she feels to anxious or upset she will return to [facility]. Pt safety plans also includes limiting her community outings with the planned group activities and with a staff member. Patient plans to follow up with [facility] 04[facility]/2013 at 10 am for her baclofen pump refill. Patient has arranged transportation with [place] transportation to get to and from her doctor's appointment in her motorized chair. Patient plans to transition to [facility] with full mental health and IDD services provided through [facility] in [facility]. CSW completed Fl2 for patient to return to ALF.   Marland KitchenCatha Gosselin, Theresia Majors  [phone] .04/26/2013 1145am

## 2013-04-26 NOTE — ED Notes (Signed)
Pt awaiting disposition back to [facility]. SW and CM working to get pt discharged today.

## 2013-04-26 NOTE — Consult Note (Signed)
Reason for Consult:  Follow up previous consult SI Referring Physician: EDP  Brianna Castillo is an 27 y.o. female.  HPI: Patient was seen with Child psychotherapist. Patient requested to discharge back to placement because he is feeling better and her perspective has been changed and her medication is working better. Patient states that she does feel safe and is able to contract for safety.    Mental Status Examination: Patient appeared lying down in bed, awake. Alert and oriented x 4. Patient has decreased psychomotor activity but has good eye contact. Patient has fine mood and his affect was anxious. He has normal rate, rhythm, and volume of speech. His thought process is linear and goal directed. Patient has denied suicidal, homicidal ideations, intentions or plans. Patient has no evidence of auditory or visual hallucinations, delusions, and paranoia. Patient has fair insight judgment and impulse control.  Past Medical History  Diagnosis Date  . Asthma   . Bipolar 1 disorder   . Cerebral palsy with spastic/ataxic diplegia     spastic  . Pancreatitis     Past Surgical History  Procedure Laterality Date  . Appendectomy    . Cholecystectomy    . Major lower body reconstruction    . Eye surgery    . Baclofen pump refill      RLQ    History reviewed. No pertinent family history.  Social History:  reports that she has never smoked. She does not have any smokeless tobacco history on file. She reports that she drinks about 0.6 ounces of alcohol per week. Her drug history is not on file.  Allergies:  Allergies  Allergen Reactions  . Methadone Hcl   . Morphine And Related     hypoxic  . Versed (Midazolam)     Medications: I have reviewed the patient's current medications.  No results found for this or any previous visit (from the past 48 hour(s)).  No results found.  Review of Systems  Constitutional: Negative.   HENT: Negative for hearing loss, ear pain, nosebleeds, congestion,  sore throat, tinnitus and ear discharge.   Eyes: Negative.        Patient states that she wears glasses for distant vision  Respiratory: Negative.  Negative for stridor.   Cardiovascular: Negative.   Genitourinary:       Patient states that she is incont of urine and at times she may have to catheterized to urinate.  Musculoskeletal:       Cerebral palsy.  Patient states that she uses a w/c no ambulation and decrease ROM orf lower ext. Bilaterally.  Patient states that she is able to move her upper ext bilaterally with out difficulty.    Skin: Negative.   Neurological: Positive for headaches (Patient states that she has started to have frequent headache relieved  with medication).  Psychiatric/Behavioral: Positive for depression and suicidal ideas. Negative for hallucinations and memory loss. Substance abuse: History.  Last use THC 6 yrs ago. The patient is nervous/anxious. The patient does not have insomnia.    Blood pressure 112/70, pulse 74, temperature 98.4 F (36.9 C), temperature source Oral, resp. rate 18, SpO2 100.00%. Physical Exam  Constitutional: She is oriented to person, place, and time.  HENT:  Head: Normocephalic.  Mouth/Throat: Oropharynx is clear and moist.  Eyes: Conjunctivae are normal. Pupils are equal, round, and reactive to light.  Neck: Normal range of motion.  Cardiovascular: Normal rate.   Respiratory: Effort normal.  Neurological: She is alert and oriented to person,  place, and time.    Assessment/Plan: Recommendation:   1. Discharge to assisted living facility e as per social service disposition plans 2. Continue with current plan of treatment for placement  3. Patient is able to contract for safety with intensive out patient resources 4. Continue current medication management until further med evaluation   Scotti Kosta,JANARDHAHA R. 04/26/2013, 10:02 AM

## 2013-04-26 NOTE — ED Notes (Signed)
Pt placed on bedpan and repositioned in bed.

## 2013-04-26 NOTE — ED Notes (Signed)
[facility] contacted to transport pt back to [facility] by [place] with SW.

## 2013-04-26 NOTE — Progress Notes (Signed)
Pt confirms pcp is  pcp is [facility] updated

## 2013-04-26 NOTE — [facility] Suicide Risk Assessment (Signed)
Suicide Risk Assessment  Discharge Assessment     Demographic Factors:  Adolescent or young adult, Caucasian, Low socioeconomic status and disabled with cerebral palsy  Mental Status Per Nursing Assessment::   On Admission:     Current Mental Status by Physician: Self-harm thoughts  Loss Factors: Decline in physical health and Financial problems/change in socioeconomic status  Historical Factors: NA  Risk Reduction Factors:   Religious beliefs about death, Positive social support, Positive therapeutic relationship and Positive coping skills or problem solving skills  Continued Clinical Symptoms:  Severe Anxiety and/or Agitation Depression:   Anhedonia Impulsivity Insomnia Recent sense of peace/wellbeing Previous Psychiatric Diagnoses and Treatments Medical Diagnoses and Treatments/Surgeries  Cognitive Features That Contribute To Risk:  Polarized thinking    Suicide Risk:  Mild:  Suicidal ideation of limited frequency, intensity, duration, and specificity.  There are no identifiable plans, no associated intent, mild dysphoria and related symptoms, good self-control (both objective and subjective assessment), few other risk factors, and identifiable protective factors, including available and accessible social support.  Discharge Diagnoses:   AXIS I:  Mood Disorder NOS AXIS II:  Deferred AXIS III:   Past Medical History  Diagnosis Date  . Asthma   . Bipolar 1 disorder   . Cerebral palsy with spastic/ataxic diplegia     spastic  . Pancreatitis    AXIS IV:  other psychosocial or environmental problems, problems related to social environment, problems with access to health care services and problems with primary support group AXIS V:  41-50 serious symptoms  Plan Of Care/Follow-up recommendations:  Activity:  as tolerated Diet:  regular  Is patient on multiple antipsychotic therapies at discharge:  No   Has Patient had three or more failed trials of antipsychotic  monotherapy by history:  No  Recommended Plan for Multiple Antipsychotic Therapies: Not applicable  Ekaterini Capitano,JANARDHAHA R. 04/26/2013, 9:53 AM

## 2013-04-30 ENCOUNTER — Ambulatory Visit (INDEPENDENT_AMBULATORY_CARE_PROVIDER_SITE_OTHER): Payer: Medicaid Other | Admitting: Psychiatry

## 2013-04-30 ENCOUNTER — Ambulatory Visit: Payer: Medicaid Other | Admitting: Psychiatry

## 2013-04-30 DIAGNOSIS — F0630 Mood disorder due to known physiological condition, unspecified: Secondary | ICD-10-CM

## 2013-05-01 ENCOUNTER — Encounter (HOSPITAL_COMMUNITY): Payer: Self-pay

## 2013-05-01 ENCOUNTER — Emergency Department (HOSPITAL_COMMUNITY)
Admission: EM | Admit: 2013-05-01 | Discharge: 2013-05-07 | Disposition: A | Discharge status: Home / Self Care | Payer: Medicaid Other | Attending: Emergency Medicine | Admitting: Emergency Medicine

## 2013-05-01 DIAGNOSIS — Z79899 Other long term (current) drug therapy: Secondary | ICD-10-CM | POA: Insufficient documentation

## 2013-05-01 DIAGNOSIS — F411 Generalized anxiety disorder: Secondary | ICD-10-CM

## 2013-05-01 DIAGNOSIS — Z8719 Personal history of other diseases of the digestive system: Secondary | ICD-10-CM | POA: Insufficient documentation

## 2013-05-01 DIAGNOSIS — F329 Major depressive disorder, single episode, unspecified: Secondary | ICD-10-CM

## 2013-05-01 DIAGNOSIS — F319 Bipolar disorder, unspecified: Secondary | ICD-10-CM | POA: Insufficient documentation

## 2013-05-01 DIAGNOSIS — F32A Depression, unspecified: Secondary | ICD-10-CM

## 2013-05-01 DIAGNOSIS — R45851 Suicidal ideations: Secondary | ICD-10-CM

## 2013-05-01 DIAGNOSIS — J45909 Unspecified asthma, uncomplicated: Secondary | ICD-10-CM | POA: Insufficient documentation

## 2013-05-01 DIAGNOSIS — Z8669 Personal history of other diseases of the nervous system and sense organs: Secondary | ICD-10-CM | POA: Insufficient documentation

## 2013-05-01 LAB — CBC WITH DIFFERENTIAL/PLATELET
Basophils Absolute: 0.1 10*3/uL (ref 0.0–0.1)
Basophils Relative: 0 % (ref 0–1)
Eosinophils Absolute: 0.2 10*3/uL (ref 0.0–0.7)
Eosinophils Relative: 1 % (ref 0–5)
HCT: 35.9 % — ABNORMAL LOW (ref 36.0–46.0)
Hemoglobin: 11.5 g/dL — ABNORMAL LOW (ref 12.0–15.0)
Lymphocytes Relative: 27 % (ref 12–46)
Lymphs Abs: 4.2 10*3/uL — ABNORMAL HIGH (ref 0.7–4.0)
MCH: 28 pg (ref 26.0–34.0)
MCHC: 32 g/dL (ref 30.0–36.0)
MCV: 87.6 fL (ref 78.0–100.0)
Monocytes Absolute: 0.9 10*3/uL (ref 0.1–1.0)
Monocytes Relative: 5 % (ref 3–12)
Neutro Abs: 10.4 10*3/uL — ABNORMAL HIGH (ref 1.7–7.7)
Neutrophils Relative %: 66 % (ref 43–77)
Platelets: 300 10*3/uL (ref 150–400)
RBC: 4.1 MIL/uL (ref 3.87–5.11)
RDW: 13 % (ref 11.5–15.5)
WBC: 15.7 10*3/uL — ABNORMAL HIGH (ref 4.0–10.5)

## 2013-05-01 LAB — COMPREHENSIVE METABOLIC PANEL
ALT: 16 U/L (ref 0–35)
AST: 13 U/L (ref 0–37)
Albumin: 3.3 g/dL — ABNORMAL LOW (ref 3.5–5.2)
Alkaline Phosphatase: 97 U/L (ref 39–117)
BUN: 8 mg/dL (ref 6–23)
CO2: 21 mEq/L (ref 19–32)
Calcium: 9.3 mg/dL (ref 8.4–10.5)
Chloride: 106 mEq/L (ref 96–112)
Creatinine, Ser: 0.48 mg/dL — ABNORMAL LOW (ref 0.50–1.10)
GFR calc Af Amer: >90 mL/min (ref >90)
GFR calc non Af Amer: >90 mL/min (ref >90)
Glucose, Bld: 99 mg/dL (ref 70–99)
Potassium: 3.6 mEq/L (ref 3.5–5.1)
Sodium: 137 mEq/L (ref 135–145)
Total Bilirubin: 0.2 mg/dL — ABNORMAL LOW (ref 0.3–1.2)
Total Protein: 6.3 g/dL (ref 6.0–8.3)

## 2013-05-01 LAB — ETHANOL: Alcohol, Ethyl (B): <11 mg/dL (ref 0–11)

## 2013-05-01 LAB — RAPID URINE DRUG SCREEN, HOSP PERFORMED
Amphetamines: NOT DETECTED
Barbiturates: NOT DETECTED
Benzodiazepines: NOT DETECTED
Cocaine: NOT DETECTED
Opiates: NOT DETECTED
Tetrahydrocannabinol: NOT DETECTED

## 2013-05-01 LAB — LITHIUM LEVEL: Lithium Lvl: 1.1 mEq/L (ref 0.80–1.40)

## 2013-05-01 MED ORDER — ALPRAZOLAM 0.5 MG PO TABS
0.5000 mg | ORAL_TABLET | Freq: Three times a day (TID) | ORAL | Status: DC | PRN
  Administered 2013-05-01 – 2013-05-05 (×3): 0.5 mg via ORAL
  Filled 2013-05-01 (×3): qty 1

## 2013-05-01 MED ORDER — VITAMIN D3 25 MCG (1000 UNIT) PO TABS
1000.0000 [IU] | ORAL_TABLET | Freq: Every morning | ORAL | Status: DC
  Administered 2013-05-02 – 2013-05-07 (×6): 1000 [IU] via ORAL
  Filled 2013-05-01 (×6): qty 1

## 2013-05-01 MED ORDER — LITHIUM CARBONATE 300 MG PO CAPS: 600.0000 mg | ORAL_CAPSULE | Freq: Every day | ORAL | Status: DC

## 2013-05-01 MED ORDER — PHENAZOPYRIDINE HCL 100 MG PO TABS
100.0000 mg | ORAL_TABLET | Freq: Three times a day (TID) | ORAL | Status: DC | PRN
  Administered 2013-05-03 – 2013-05-06 (×2): 100 mg via ORAL
  Filled 2013-05-01 (×3): qty 1

## 2013-05-01 MED ORDER — LITHIUM CARBONATE 300 MG PO CAPS
750.0000 mg | ORAL_CAPSULE | Freq: Every day | ORAL | Status: DC
  Administered 2013-05-01 – 2013-05-05 (×5): 750 mg via ORAL
  Filled 2013-05-01 (×7): qty 1

## 2013-05-01 MED ORDER — TAMSULOSIN HCL 0.4 MG PO CAPS
0.4000 mg | ORAL_CAPSULE | Freq: Two times a day (BID) | ORAL | Status: DC
  Administered 2013-05-01 – 2013-05-07 (×12): 0.4 mg via ORAL
  Filled 2013-05-01 (×12): qty 1

## 2013-05-01 MED ORDER — ARIPIPRAZOLE 2 MG PO TABS
4.0000 mg | ORAL_TABLET | Freq: Every day | ORAL | Status: DC
  Administered 2013-05-01 – 2013-05-06 (×6): 4 mg via ORAL
  Filled 2013-05-01 (×9): qty 2

## 2013-05-01 MED ORDER — LITHIUM CARBONATE 150 MG PO CAPS: 150.0000 mg | ORAL_CAPSULE | Freq: Every day | ORAL | Status: DC

## 2013-05-01 MED ORDER — BETHANECHOL CHLORIDE 25 MG PO TABS
25.0000 mg | ORAL_TABLET | Freq: Three times a day (TID) | ORAL | Status: DC
  Administered 2013-05-01 – 2013-05-07 (×17): 25 mg via ORAL
  Filled 2013-05-01 (×23): qty 1

## 2013-05-01 MED ORDER — ALBUTEROL SULFATE HFA 108 (90 BASE) MCG/ACT IN AERS: 1.0000 | INHALATION_SPRAY | Freq: Four times a day (QID) | RESPIRATORY_TRACT | Status: DC | PRN

## 2013-05-01 MED ORDER — SENNOSIDES-DOCUSATE SODIUM 8.6-50 MG PO TABS: 3.0000 | ORAL_TABLET | ORAL | Status: DC | PRN

## 2013-05-01 MED ORDER — ALBUTEROL SULFATE (5 MG/ML) 0.5% IN NEBU: 2.5000 mg | INHALATION_SOLUTION | RESPIRATORY_TRACT | Status: DC | PRN

## 2013-05-01 MED ORDER — LORATADINE 10 MG PO TABS
10.0000 mg | ORAL_TABLET | Freq: Every day | ORAL | Status: DC | PRN
  Filled 2013-05-01: qty 1

## 2013-05-01 MED ORDER — OXYCODONE-ACETAMINOPHEN 5-325 MG PO TABS: 1.0000 | ORAL_TABLET | Freq: Four times a day (QID) | ORAL | Status: DC | PRN

## 2013-05-01 MED ORDER — BENZONATATE 100 MG PO CAPS: 100.0000 mg | ORAL_CAPSULE | Freq: Three times a day (TID) | ORAL | Status: DC | PRN

## 2013-05-01 MED ORDER — GABAPENTIN 100 MG PO CAPS
100.0000 mg | ORAL_CAPSULE | Freq: Two times a day (BID) | ORAL | Status: DC
  Administered 2013-05-01 – 2013-05-07 (×12): 100 mg via ORAL
  Filled 2013-05-01 (×15): qty 1

## 2013-05-01 MED ORDER — HYDROCERIN EX CREA
TOPICAL_CREAM | Freq: Every day | CUTANEOUS | Status: DC
  Administered 2013-05-01 – 2013-05-05 (×5): via TOPICAL
  Filled 2013-05-01 (×3): qty 113

## 2013-05-01 MED ORDER — MINERIN CREME EX CREA: 1.0000 "application " | TOPICAL_CREAM | Freq: Every day | CUTANEOUS | Status: DC

## 2013-05-01 NOTE — Progress Notes (Signed)
CSW and Mobile Crisis currently working towards placement for patient. CSW familiar with patient due to previous ED visit last week, and was denied at other hospitals due to total care acuity and a state hosptial was the only available inpatient psychiatric hospital. Patient was on [facility] wait list from 5/21 to 5/30.  TA Mobile Crisis referred patient again to [facility] however was declined due patient having Baclofen pump. When TA and CSW spoke with Junious Dresser at [facility], [facility] stated that the Baclofen pump must have been overlooked or were not aware. Connie at [facility] spoke with Dr. Elbert Ewings who declined patient due to Baclofen Pump.   TA Mobile crisis referred patient to [facility] pending reivew. CSW spoke with Judeth Cornfield who stated that the doctor would need to review patient information before decision could be made.   CSW has sought further supervision from Photographer awaiting call back.   Catha Gosselin, LCSWA  [phone] .05/01/2013 1523pm

## 2013-05-01 NOTE — ED Notes (Signed)
[id number]<QM> Expected date:<BR> Expected time:<BR> Means of arrival:<BR> Comments:<BR> EMS/Suicidal

## 2013-05-01 NOTE — Progress Notes (Signed)
Patient is currently be followed by theraputic alternatives with mobile crisis for bed placement. Per discussion with treatment team, patient is recommended for inpatient psychiatric treatment due to continued SI with plans to strangle self or role wheelchair into traffic.   CSW met with pt at bedside as requested by patient. Patient and CSW discussed concerns at [facility] house Assisted living. CSW will discuss with director further regarding concerns. Patient and CSW discussed patient talking with Obudsman regarding patient concerns, however pt states she is fearful for retaliation by staff.   Patient continues to express suicidal ideation. Patient states, "i am still having those thoughts, and I asked to have my phone to be taken out of the room when I arrived because I had thought about using the long phone cord." Patient states, "I dont' understand I was feeling so much better Thursday and Friday, and I thought I was past all this, and then everything went down hill. Patient reports an argument with a fellow resident brought back feelings of worthlessness, and hopelessness. Patient reports feeling as if she was used by the other resident to have sex, and was then told she was not dating material due to her bipolar history, that she doesn't pay her own bills, and that she has never lived on her own."    .Brianna Castillo, Brianna Castillo  [phone] .05/01/2013 1400pm

## 2013-05-01 NOTE — ED Provider Notes (Signed)
History    27yF with SI with plans to go into traffic or hang herself. Pt with recent ED/psych evaluation. Current complaint essentially seems to be stemming from disagreement with another resident at her assisted living facility. Pt questioning if her medication needs to be adjusted to "deal with this." Plan in place for transition to [facility] with full mental health and IDD services provided through [facility] in [facility] in two weeks.   CSN: [id number]  Arrival date & time 05/01/13  6578   First MD Initiated Contact with Patient 05/01/13 [phone]      Chief Complaint  Patient presents with  . Suicidal    (Consider location/radiation/quality/duration/timing/severity/associated sxs/prior treatment) HPI  Past Medical History  Diagnosis Date  . Asthma   . Bipolar 1 disorder   . Cerebral palsy with spastic/ataxic diplegia     spastic  . Pancreatitis     Past Surgical History  Procedure Laterality Date  . Appendectomy    . Cholecystectomy    . Major lower body reconstruction    . Eye surgery    . Baclofen pump refill      RLQ    History reviewed. No pertinent family history.  History  Substance Use Topics  . Smoking status: Never Smoker   . Smokeless tobacco: Not on file  . Alcohol Use: 0.6 oz/week    1 Shots of liquor per week     Comment: social drinker 1x  a moth     OB History   Grav Para Term Preterm Abortions TAB SAB Ect Mult Living   0 0 0 0 0 0 0 0 0 0       Review of Systems  All systems reviewed and negative, other than as noted in HPI.   Allergies  Methadone hcl; Morphine and related; and Versed  Home Medications   Current Outpatient Rx  Name  Route  Sig  Dispense  Refill  . albuterol (PROVENTIL HFA;VENTOLIN HFA) 108 (90 BASE) MCG/ACT inhaler   Inhalation   Inhale 1-2 puffs into the lungs every 6 (six) hours as needed for wheezing.   1 Inhaler   0   . albuterol (PROVENTIL) (2.5 MG/3ML) 0.083% nebulizer solution    Nebulization   Take 2.5 mg by nebulization every 4 (four) hours as needed (cough and wheezing).         . ALPRAZolam (XANAX) 0.5 MG tablet   Oral   Take 0.5 mg by mouth 3 (three) times daily as needed for anxiety.         . ARIPiprazole (ABILIFY) 2 MG tablet   Oral   Take 4 mg by mouth at bedtime.          . benzonatate (TESSALON) 100 MG capsule   Oral   Take 100 mg by mouth 3 (three) times daily as needed for cough.         . bethanechol (URECHOLINE) 25 MG tablet   Oral   Take 25 mg by mouth 3 (three) times daily.         . cholecalciferol (VITAMIN D) 1000 UNITS tablet   Oral   Take 1,000 Units by mouth every morning.         . gabapentin (NEURONTIN) 100 MG capsule   Oral   Take 100 mg by mouth 2 (two) times daily.         Marland Kitchen lithium carbonate 150 MG capsule   Oral   Take 150 mg by mouth at bedtime.         Marland Kitchen  lithium carbonate 300 MG capsule   Oral   Take 600 mg by mouth at bedtime.         Marland Kitchen loratadine (CLARITIN) 10 MG tablet   Oral   Take 10 mg by mouth daily as needed (post-nasal drip).         Marland Kitchen oxyCODONE-acetaminophen (PERCOCET/ROXICET) 5-325 MG per tablet   Oral   Take 1 tablet by mouth every 6 (six) hours as needed (pain).         . phenazopyridine (PYRIDIUM) 100 MG tablet   Oral   Take 100 mg by mouth 3 (three) times daily as needed for pain (dysuria).         . senna-docusate (SENOKOT-S) 8.6-50 MG per tablet   Oral   Take 3 tablets by mouth as needed for constipation.         . Skin Protectants, Misc. (MINERIN) CREA   Apply externally   Apply 1 application topically at bedtime.         . tamsulosin (FLOMAX) 0.4 MG CAPS   Oral   Take 0.4 mg by mouth 2 (two) times daily.            BP 144/73  Pulse 118  Temp(Src) 99.2 F (37.3 C) (Oral)  Resp 20  SpO2 94%  Physical Exam  Nursing note and vitals reviewed. Constitutional: She appears well-developed and well-nourished. No distress.  HENT:  Head: Normocephalic  and atraumatic.  Eyes: Conjunctivae are normal. Right eye exhibits no discharge. Left eye exhibits no discharge.  Neck: Neck supple.  Cardiovascular: Normal rate, regular rhythm and normal heart sounds.  Exam reveals no gallop and no friction rub.   No murmur heard. Pulmonary/Chest: Effort normal and breath sounds normal. No respiratory distress.  Abdominal: Soft. She exhibits no distension. There is no tenderness.  Musculoskeletal: She exhibits no edema and no tenderness.  Neurological: She is alert.  Skin: Skin is warm and dry.  Psychiatric: She has a normal mood and affect. Her behavior is normal. Thought content normal.    ED Course  Procedures (including critical care time)  Labs Reviewed  CBC WITH DIFFERENTIAL - Abnormal; Notable for the following:    WBC 15.7 (*)    Hemoglobin 11.5 (*)    HCT 35.9 (*)    Neutro Abs 10.4 (*)    Lymphs Abs 4.2 (*)    All other components within normal limits  COMPREHENSIVE METABOLIC PANEL - Abnormal; Notable for the following:    Creatinine, Ser 0.48 (*)    Albumin 3.3 (*)    Total Bilirubin 0.2 (*)    All other components within normal limits  ETHANOL  URINE RAPID DRUG SCREEN (HOSP PERFORMED)  LITHIUM LEVEL   No results found.   1. Depression   2. Suicidal ideation       MDM  27yF with depression and SI. Recent evaluation for similar. Pt questioning if psychiatric medications need to be adjusted. Will obtain psych consultation. Pt medically cleared.        Raeford Razor, MD 05/01/13 [phone]

## 2013-05-01 NOTE — Progress Notes (Signed)
Discussed at [facility] ED psychiatry Meeting.  Pt scheduled to go to new group home May 13 2013 but scheduled to go home with her parents during father's day weekend prior to moving in to group home Pending further [facility] evaluation and disposition plan

## 2013-05-01 NOTE — ED Notes (Signed)
Per EMS pt expresses SI. Pt stated that she was going to hang herself or run into traffic. Staff stated that she tried to strangle her self. No injuries noted. Pt stated that she wish she had a pill to end it all.

## 2013-05-02 ENCOUNTER — Encounter (HOSPITAL_COMMUNITY): Payer: Self-pay | Admitting: Registered Nurse

## 2013-05-02 MED ORDER — IBUPROFEN 800 MG PO TABS
800.0000 mg | ORAL_TABLET | Freq: Once | ORAL | Status: AC
  Administered 2013-05-02: 800 mg via ORAL
  Filled 2013-05-02: qty 1

## 2013-05-02 NOTE — Progress Notes (Addendum)
Addendum: Patient declined from [place] due to patient current catchment area. Moises Blood stated that patient current address is under [facility], and once patient moves to [place] then patient can be referred to [place]. Pt pending review at [place], [facility], and [place]. Patient has been denied from all other hospitals due to total care acuity.   Addendum: Per discussion with mobile crisis, pt called mother who shared with patient that they do not have the money to help move patient from assisted living to her new group home in [place]. Patient was upset, and told mother she needed to get off the phone. Pt gave csw permission to speak with Boykin Nearing again at [facility] center and mobile crisis. CSW spoke with Mr. Lindie Spruce who direct CSW to call Nida Boatman at [facility] who is head of the [place] group home to see if any funds were available to assist with the move. CSW will follow up with Group home.   Patient continues to report SI with plan to strangle self or ride wheelchair into traffic. Patient requested to not use corded phone because of the thoughts. Patient continues have thoughts of strangling herself with cords. Patient call bell within reach of sitter but not patient.   Pt discussed in [facility] meeting. CSW provided Dr. Elbert Ewings number at [facility] to contact Dr. Jacky Kindle regarding patient baclofen pump and referral.   Per discussion with mobile crisis, patient pending review at [facility].   Pt declined by area hospital due to total care acuity.   Catha Gosselin, LCSWA  [phone] 05/02/2013 1203pm
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
## 2013-05-02 NOTE — ED Notes (Signed)
Saltines and peanut butter given to pt. Per pt request.  Call received from [facility] team stating that pt has been declined from multiple psych facilities and they are working to get pt in to [facility].

## 2013-05-02 NOTE — ED Notes (Signed)
Sitter remains at bedside

## 2013-05-02 NOTE — Consult Note (Signed)
Reason for Consult: Follow up evaluations for inpatient treatment Referring Physician: EDP  JONA ERKKILA is an 27 y.o. female.  HPI: Patient continues to have feelings of depression and SI thoughts.  Patient is unable to contract for safety.  Patient also states that she was hearing voices last night.    Past Medical History  Diagnosis Date  . Asthma   . Bipolar 1 disorder   . Cerebral palsy with spastic/ataxic diplegia     spastic  . Pancreatitis     Past Surgical History  Procedure Laterality Date  . Appendectomy    . Cholecystectomy    . Major lower body reconstruction    . Eye surgery    . Baclofen pump refill      RLQ    History reviewed. No pertinent family history.  Social History:  reports that she has never smoked. She does not have any smokeless tobacco history on file. She reports that she drinks about 0.6 ounces of alcohol per week. Her drug history is not on file.  Allergies:  Allergies  Allergen Reactions  . Methadone Hcl   . Morphine And Related     hypoxic  . Versed (Midazolam)     Medications: I have reviewed the patient's current medications.  Results for orders placed during the hospital encounter of 05/01/13 (from the past 48 hour(s))  CBC WITH DIFFERENTIAL     Status: Abnormal   Collection Time    05/01/13  3:57 AM      Result Value Range   WBC 15.7 (*) 4.0 - 10.5 K/uL   RBC 4.10  3.87 - 5.11 MIL/uL   Hemoglobin 11.5 (*) 12.0 - 15.0 g/dL   HCT 09.8 (*) 11.9 - 14.7 %   MCV 87.6  78.0 - 100.0 fL   MCH 28.0  26.0 - 34.0 pg   MCHC 32.0  30.0 - 36.0 g/dL   RDW 82.9  56.2 - 13.0 %   Platelets 300  150 - 400 K/uL   Neutrophils Relative % 66  43 - 77 %   Neutro Abs 10.4 (*) 1.7 - 7.7 K/uL   Lymphocytes Relative 27  12 - 46 %   Lymphs Abs 4.2 (*) 0.7 - 4.0 K/uL   Monocytes Relative 5  3 - 12 %   Monocytes Absolute 0.9  0.1 - 1.0 K/uL   Eosinophils Relative 1  0 - 5 %   Eosinophils Absolute 0.2  0.0 - 0.7 K/uL   Basophils Relative 0  0 - 1  %   Basophils Absolute 0.1  0.0 - 0.1 K/uL  COMPREHENSIVE METABOLIC PANEL     Status: Abnormal   Collection Time    05/01/13  3:57 AM      Result Value Range   Sodium 137  135 - 145 mEq/L   Potassium 3.6  3.5 - 5.1 mEq/L   Chloride 106  96 - 112 mEq/L   CO2 21  19 - 32 mEq/L   Glucose, Bld 99  70 - 99 mg/dL   BUN 8  6 - 23 mg/dL   Creatinine, Ser 8.65 (*) 0.50 - 1.10 mg/dL   Calcium 9.3  8.4 - 78.4 mg/dL   Total Protein 6.3  6.0 - 8.3 g/dL   Albumin 3.3 (*) 3.5 - 5.2 g/dL   AST 13  0 - 37 U/L   ALT 16  0 - 35 U/L   Alkaline Phosphatase 97  39 - 117 U/L   Total Bilirubin  0.2 (*) 0.3 - 1.2 mg/dL   GFR calc non Af Amer >90  >90 mL/min   GFR calc Af Amer >90  >90 mL/min   Comment:            The eGFR has been calculated     using the CKD EPI equation.     This calculation has not been     validated in all clinical     situations.     eGFR's persistently     <90 mL/min signify     possible Chronic Kidney Disease.  ETHANOL     Status: None   Collection Time    05/01/13  3:57 AM      Result Value Range   Alcohol, Ethyl (B) <11  0 - 11 mg/dL   Comment:            LOWEST DETECTABLE LIMIT FOR     SERUM ALCOHOL IS 11 mg/dL     FOR MEDICAL PURPOSES ONLY  LITHIUM LEVEL     Status: None   Collection Time    05/01/13  3:57 AM      Result Value Range   Lithium Lvl 1.10  0.80 - 1.40 mEq/L  URINE RAPID DRUG SCREEN (HOSP PERFORMED)     Status: None   Collection Time    05/01/13  4:37 AM      Result Value Range   Opiates NONE DETECTED  NONE DETECTED   Cocaine NONE DETECTED  NONE DETECTED   Benzodiazepines NONE DETECTED  NONE DETECTED   Amphetamines NONE DETECTED  NONE DETECTED   Tetrahydrocannabinol NONE DETECTED  NONE DETECTED   Barbiturates NONE DETECTED  NONE DETECTED   Comment:            DRUG SCREEN FOR MEDICAL PURPOSES     ONLY.  IF CONFIRMATION IS NEEDED     FOR ANY PURPOSE, NOTIFY LAB     WITHIN 5 DAYS.                LOWEST DETECTABLE LIMITS     FOR URINE DRUG  SCREEN     Drug Class       Cutoff (ng/mL)     Amphetamine      1000     Barbiturate      200     Benzodiazepine   200     Tricyclics       300     Opiates          300     Cocaine          300     THC              50    No results found.  Review of Systems  Constitutional: Negative.   Eyes: Negative.        Patient states that she wears glasses for distant vision  Respiratory: Negative.   Cardiovascular: Negative.   Genitourinary:       Patient states that she is incont of urine and at times she may have to catheterized to urinate.  Musculoskeletal:       Cerebral palsy.  Patient states that she uses a w/c no ambulation and decrease ROM orf lower ext. Bilaterally.  Patient states that she is able to move her upper ext bilaterally with out difficulty.    Skin: Negative.   Neurological: Positive for headaches.  Psychiatric/Behavioral: Positive for depression ( ) and suicidal ideas. Negative for hallucinations  and memory loss. The patient is nervous/anxious. The patient does not have insomnia.    Blood pressure 101/52, pulse 69, temperature 98.6 F (37 C), temperature source Axillary, resp. rate 16, SpO2 94.00%. Physical Exam  Constitutional: She is oriented to person, place, and time.  HENT:  Head: Normocephalic.  Mouth/Throat: Oropharynx is clear and moist.  Eyes: Conjunctivae are normal. Pupils are equal, round, and reactive to light.  Neck: Normal range of motion.  Cardiovascular: Normal rate.   Respiratory: Effort normal.  Neurological: She is alert and oriented to person, place, and time.    Assessment/Plan:  Consulted with Dr. Lolly Mustache and face to face interview Recommendations:  Still in agreement with previous assessment/consult for inpatient treatment.    Rankin, Shuvon 05/02/2013, 10:37 AM     I am agreed with the findings and involve in the treatment plan.

## 2013-05-03 NOTE — Consult Note (Signed)
Reason for Consult:Suicide Referring Physician: Compas K  Brianna Castillo is an 27 y.o. female.  HPI: This is a readmission for this 27 year old caucasian lady for suicidal thoughts.  She reports still  feeling suicidal and cannot contract for safety.  She has a 1:1  Comptroller for safety.  We will continue administering care while also looking for placement to an inpatient psychiatric facility.  Past Medical History  Diagnosis Date  . Asthma   . Bipolar 1 disorder   . Cerebral palsy with spastic/ataxic diplegia     spastic  . Pancreatitis     Past Surgical History  Procedure Laterality Date  . Appendectomy    . Cholecystectomy    . Major lower body reconstruction    . Eye surgery    . Baclofen pump refill      RLQ    History reviewed. No pertinent family history.  Social History:  reports that she has never smoked. She does not have any smokeless tobacco history on file. She reports that she drinks about 0.6 ounces of alcohol per week. Her drug history is not on file.  Allergies:  Allergies  Allergen Reactions  . Methadone Hcl   . Morphine And Related     hypoxic  . Versed (Midazolam)     Medications: I have reviewed the patient's current medications.  No results found for this or any previous visit (from the past 48 hour(s)).  No results found.  Review of Systems  Constitutional: Negative.  Negative for fever, chills, weight loss, malaise/fatigue and diaphoresis.  HENT: Negative.   Eyes: Negative.   Respiratory: Negative.   Cardiovascular: Negative.   Gastrointestinal: Negative.   Genitourinary:       She is incontinent of urine and occasionally does self cath for urine  Musculoskeletal:       Uses W/C.  Hx of cerebral palsy.  Skin: Negative.   Neurological: Negative.  Negative for weakness.  Endo/Heme/Allergies: Negative.   Psychiatric/Behavioral: Positive for depression (Reports depression and constantly suicidal, stabilized on medication), suicidal ideas  (Constantly suicidal, stabilized on medication) and memory loss (Occasssional memory loss.). Negative for hallucinations and substance abuse. The patient is nervous/anxious ( stabilized on xanax for anxiety). The patient does not have insomnia.    Blood pressure 92/51, pulse 85, temperature 98.1 F (36.7 C), temperature source Oral, resp. rate 16, SpO2 95.00%. Physical Exam  Constitutional: She is oriented to person, place, and time. She appears well-nourished.  HENT:  Head: Atraumatic.  Eyes: Conjunctivae are normal.  Neck: Neck supple.  Cardiovascular: Normal rate, normal heart sounds and intact distal pulses.   Respiratory: Effort normal and breath sounds normal.  GI: Soft. Bowel sounds are normal.  Musculoskeletal:  Uses W/C with hx of cerebral palsy.  Neurological: She is alert and oriented to person, place, and time.  Skin: Skin is warm and dry.    Assessment/Plan: Face to face interview with Dr Lolly Mustache. Plan: Continue 1:1 sitter at bedside.  Remove all harmful equipment including monitor cords near patient.  Continue daily care and continue inpatient referrals.  [name], C  PMHNP-BC 05/03/2013, 12:28 PM     I personally seen the patient agreed with the findings and involved in the treatment plan.

## 2013-05-04 NOTE — ED Notes (Signed)
Pt calm and resting quietly sitter remains at bedside.

## 2013-05-04 NOTE — ED Notes (Signed)
Patient is resting comfortably. Sitter remains at bedside.

## 2013-05-04 NOTE — ED Notes (Signed)
Pt calm and resting quietly and states she is very tired and not sure if it is because of the depression or medication she received last night.

## 2013-05-05 LAB — URINALYSIS, ROUTINE W REFLEX MICROSCOPIC
Bilirubin Urine: NEGATIVE
Glucose, UA: NEGATIVE mg/dL
Hgb urine dipstick: NEGATIVE
Protein, ur: NEGATIVE mg/dL
Specific Gravity, Urine: 1.017 (ref 1.005–1.030)
Urobilinogen, UA: 0.2 mg/dL (ref 0.0–1.0)

## 2013-05-05 LAB — URINE MICROSCOPIC-ADD ON

## 2013-05-05 NOTE — ED Notes (Signed)
[facility] at bedside

## 2013-05-05 NOTE — BH Assessment (Signed)
[facility] Assessment Progress Note  Pt reviewed by Jacquelyne Balint, RN, Administrative Coordinator.  She notes that pt will require total care support for ADL's, which [facility] is not able to support.  She is therefore declining pt for admission to [facility].  Doylene Canning, MA Assessment Counselor 05/05/2013 @ 18:43

## 2013-05-05 NOTE — BH Assessment (Signed)
[facility] Assessment Progress Note     [facility] is working on placement for pt.

## 2013-05-05 NOTE — ED Notes (Signed)
Patient ate 50% of her dinner.

## 2013-05-05 NOTE — ED Provider Notes (Signed)
Filed Vitals:   05/05/13 0500  BP: 116/64  Pulse: 84  Temp: 99.1 F (37.3 C)  Resp: 16   Patient is sleeping. She has not had any issues overnight. She is still awaiting placement.  Rolan Bucco, MD 05/05/13 [phone]

## 2013-05-06 DIAGNOSIS — F3130 Bipolar disorder, current episode depressed, mild or moderate severity, unspecified: Secondary | ICD-10-CM

## 2013-05-06 DIAGNOSIS — F411 Generalized anxiety disorder: Secondary | ICD-10-CM

## 2013-05-06 MED ORDER — IBUPROFEN 200 MG PO TABS
400.0000 mg | ORAL_TABLET | Freq: Once | ORAL | Status: AC
  Administered 2013-05-06: 400 mg via ORAL
  Filled 2013-05-06: qty 2

## 2013-05-06 MED ORDER — LITHIUM CARBONATE 300 MG PO CAPS
900.0000 mg | ORAL_CAPSULE | Freq: Every day | ORAL | Status: DC
  Administered 2013-05-06: 900 mg via ORAL
  Filled 2013-05-06: qty 3

## 2013-05-06 NOTE — [facility] Counselor (Signed)
[facility] continues to work on placement for patient. Marijean Niemann from [facility] was at [facility] 6/8 pm and spoke w/ patient and worked on bed finding.  Evette Cristal, [place] Assessment Counselor

## 2013-05-06 NOTE — Progress Notes (Addendum)
Per discussion with NP, and Dr. Lucianne Muss, patient lithium levels were low. Patient lithium increased to 900 mg. Patient dc pending follow up psychiatrist appointment to check lithium levels in 3 days.   CSW awaiting return call from [place] [facility] and Dr. Bernette Redbird office regarding lithium levels. Appropriate staff not available to ensure labs would be drawn and follow up appointment would be made for appropriate time with nurse practioner or with psychiatrist. CSW to follow up first thing in the am.   .Catha Gosselin, LCSWA  [phone] 05/06/2013 1617pm
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
## 2013-05-06 NOTE — Progress Notes (Signed)
[facility] ED CM spoke with pt about d/c plans Pt informed CM if needed her choice for home health agency is 1) [place] and then 2 ) advanced home care if [facility] unable to provide services Pt reports her mother lives in [place]) Pending further SW interventions
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
## 2013-05-06 NOTE — Progress Notes (Signed)
Patient ID: Brianna Castillo, female   DOB: 09/24/1986, 27 y.o.   MRN: [id number] Lithium level back at 0.34 this afternoon.  We will increase her Lithium Carbonate  to 900 mg po QHS.  She will need repeat Lithium level in three days and her outpatient psychiatrist will need to titrate her Lithium Carbonate as needed.  Our SW is on the phone with her outpatient psychiatrist arranging follow up care that will include blood work.

## 2013-05-06 NOTE — Progress Notes (Signed)
[facility] MD Progress Note  05/06/2013 2:48 PM Brianna Castillo  MRN:  [id number] Subjective:  Patient who is well known to the ER was admitted last Thursday for suicidal thoughts and not wanting to move back to her group home.  She has been waiting for acceptance by any facility and is been rejected by several of them due to her use of Baclofen pump.  Patient is on 1:1 safety care and is given all needed care.  Today she denies SI and states her auditory hallucination is no longer telling her to kill herself but tells her to isolate herself.  She contracted for safety and states she will be willing to move to her mother's home.  Mother is willing to accept her.  Face to face interview was done by this writer and Dr Lucianne Muss and we agreed to obtain lithium level before discharge.   Diagnosis:   Axis I: Bipolar, Depressed, Depressive Disorder NOS and Generalized Anxiety Disorder Axis II: Deferred Axis III:  Past Medical History  Diagnosis Date  . Asthma   . Bipolar 1 disorder   . Cerebral palsy with spastic/ataxic diplegia     spastic  . Pancreatitis    Axis IV: housing problems, other psychosocial or environmental problems and problems related to social environment Axis V: 61-70 mild symptoms  ADL's:  Impaired  Sleep: Good  Appetite:  Good  Suicidal Ideation:  Plan:  none Intent:  none Means:  none Homicidal Ideation:  Plan:  none Intent:  none Means:  none AEB (as evidenced by):  Psychiatric Specialty Exam: Review of Systems  Constitutional: Negative.  Negative for fever and chills.  HENT: Negative.   Eyes: Negative.   Respiratory: Negative.  Negative for cough, hemoptysis, sputum production and shortness of breath.   Cardiovascular: Negative.  Negative for chest pain, palpitations, orthopnea, claudication, leg swelling and PND.  Gastrointestinal: Positive for constipation (Reports occassional constipation and she staes she goes to [employer] to buy Mag Citrate.). Negative for  heartburn, nausea, vomiting, abdominal pain, diarrhea, blood in stool and melena.  Genitourinary: Negative for dysuria, urgency, frequency, hematuria and flank pain.       Occassional urinary incontinence  Musculoskeletal:       Hx of cerebral Palsy  Skin: Negative.   Psychiatric/Behavioral: Positive for depression (Ongoing depression on medication), suicidal ideas (Denies SI today) and hallucinations (Reports auditory hallucination telling her to isolate herself). Negative for memory loss and substance abuse. The patient is nervous/anxious (Stabilized on medication). The patient does not have insomnia.     Blood pressure 100/58, pulse 82, temperature 97.6 F (36.4 C), temperature source Oral, resp. rate 16, SpO2 93.00%.There is no weight on file to calculate BMI.  General Appearance: Casual and Fairly Groomed  Patent attorney::  Fair  Speech:  Clear and Coherent  Volume:  Normal  Mood:  Depressed  Affect:  Congruent and Depressed  Thought Process:  Coherent and Logical  Orientation:  Full (Time, Place, and Person)  Thought Content:  Hallucinations: Auditory  Suicidal Thoughts:  No  Homicidal Thoughts:  No  Memory:  Immediate;   Good Recent;   Good Remote;   Good  Judgement:  Fair  Insight:  Fair  Psychomotor Activity:  Decreased and Patient is well known cerebral palsy patient.  Concentration:  Fair  Recall:  Fair  Akathisia:  Unable to assess.  Patient is well known for Cerebral palsy and on w/c  Handed:  Ambidextrous  AIMS (if indicated):     Assets:  Desire for Improvement Housing  Sleep:      Current Medications: Current Facility-Administered Medications  Medication Dose Route Frequency Provider Last Rate Last Dose  . albuterol (PROVENTIL HFA;VENTOLIN HFA) 108 (90 BASE) MCG/ACT inhaler 1-2 puff  1-2 puff Inhalation Q6H PRN Celene Kras, MD      . albuterol (PROVENTIL) (5 MG/ML) 0.5% nebulizer solution 2.5 mg  2.5 mg Nebulization Q4H PRN Celene Kras, MD      . ALPRAZolam  Prudy Feeler) tablet 0.5 mg  0.5 mg Oral TID PRN Celene Kras, MD   0.5 mg at 05/05/13 0939  . ARIPiprazole (ABILIFY) tablet 4 mg  4 mg Oral QHS Celene Kras, MD   4 mg at 05/05/13 2140  . benzonatate (TESSALON) capsule 100 mg  100 mg Oral TID PRN Celene Kras, MD      . bethanechol (URECHOLINE) tablet 25 mg  25 mg Oral TID Celene Kras, MD   25 mg at 05/06/13 0948  . cholecalciferol (VITAMIN D) tablet 1,000 Units  1,000 Units Oral q morning - 10a Celene Kras, MD   1,000 Units at 05/06/13 [phone]  . gabapentin (NEURONTIN) capsule 100 mg  100 mg Oral BID Celene Kras, MD   100 mg at 05/06/13 0948  . hydrocerin (EUCERIN) cream   Topical QHS Raeford Razor, MD      . lithium carbonate capsule 750 mg  750 mg Oral QHS Celene Kras, MD   750 mg at 05/05/13 2141  . loratadine (CLARITIN) tablet 10 mg  10 mg Oral Daily PRN Celene Kras, MD      . oxyCODONE-acetaminophen (PERCOCET/ROXICET) 5-325 MG per tablet 1 tablet  1 tablet Oral Q6H PRN Celene Kras, MD      . phenazopyridine (PYRIDIUM) tablet 100 mg  100 mg Oral TID PRN Celene Kras, MD   100 mg at 05/06/13 0949  . senna-docusate (Senokot-S) tablet 3 tablet  3 tablet Oral PRN Celene Kras, MD      . tamsulosin [facility]) capsule 0.4 mg  0.4 mg Oral BID Celene Kras, MD   0.4 mg at 05/06/13 1191   Current Outpatient Prescriptions  Medication Sig Dispense Refill  . albuterol (PROVENTIL HFA;VENTOLIN HFA) 108 (90 BASE) MCG/ACT inhaler Inhale 1-2 puffs into the lungs every 6 (six) hours as needed for wheezing.  1 Inhaler  0  . albuterol (PROVENTIL) (2.5 MG/3ML) 0.083% nebulizer solution Take 2.5 mg by nebulization every 4 (four) hours as needed (cough and wheezing).      . ALPRAZolam (XANAX) 0.5 MG tablet Take 0.5 mg by mouth 3 (three) times daily as needed for anxiety.      . ARIPiprazole (ABILIFY) 2 MG tablet Take 4 mg by mouth at bedtime.       . benzonatate (TESSALON) 100 MG capsule Take 100 mg by mouth 3 (three) times daily as needed for cough.      . bethanechol  (URECHOLINE) 25 MG tablet Take 25 mg by mouth 3 (three) times daily.      . cholecalciferol (VITAMIN D) 1000 UNITS tablet Take 1,000 Units by mouth every morning.      . gabapentin (NEURONTIN) 100 MG capsule Take 100 mg by mouth 2 (two) times daily.      Marland Kitchen lithium 300 MG tablet Take 900 mg by mouth at bedtime.      Marland Kitchen lithium carbonate 150 MG capsule Take 150 mg by mouth at bedtime.      Marland Kitchen  loratadine (CLARITIN) 10 MG tablet Take 10 mg by mouth daily as needed (post-nasal drip).      Marland Kitchen oxyCODONE-acetaminophen (PERCOCET/ROXICET) 5-325 MG per tablet Take 1 tablet by mouth every 6 (six) hours as needed (pain).      . phenazopyridine (PYRIDIUM) 100 MG tablet Take 100 mg by mouth 3 (three) times daily as needed for pain (dysuria).      . senna-docusate (SENOKOT-S) 8.6-50 MG per tablet Take 3 tablets by mouth as needed for constipation.      . Skin Protectants, Misc. (MINERIN) CREA Apply 1 application topically at bedtime.      . tamsulosin (FLOMAX) 0.4 MG CAPS Take 0.4 mg by mouth 2 (two) times daily.         Lab Results:  Results for orders placed during the hospital encounter of 05/01/13 (from the past 48 hour(s))  URINALYSIS, ROUTINE W REFLEX MICROSCOPIC     Status: Abnormal   Collection Time    05/05/13 11:31 AM      Result Value Range   Color, Urine YELLOW  YELLOW   APPearance CLEAR  CLEAR   Specific Gravity, Urine 1.017  1.005 - 1.030   pH 7.0  5.0 - 8.0   Glucose, UA NEGATIVE  NEGATIVE mg/dL   Hgb urine dipstick NEGATIVE  NEGATIVE   Bilirubin Urine NEGATIVE  NEGATIVE   Ketones, ur NEGATIVE  NEGATIVE mg/dL   Protein, ur NEGATIVE  NEGATIVE mg/dL   Urobilinogen, UA 0.2  0.0 - 1.0 mg/dL   Nitrite NEGATIVE  NEGATIVE   Leukocytes, UA TRACE (*) NEGATIVE  URINE MICROSCOPIC-ADD ON     Status: None   Collection Time    05/05/13 11:31 AM      Result Value Range   Squamous Epithelial / LPF RARE  RARE   WBC, UA 0-2  <3 WBC/hpf    Physical Findings: AIMS:  , ,  ,  ,    CIWA:    COWS:      Treatment Plan Summary: Daily contact with patient to assess and evaluate symptoms and progress in treatment Medication management Obtain Lithium level today before d/c to mom today.  Plan:  Medical Decision Making Problem Points:  Established problem, stable/improving (1) Data Points:  Review and summation of old records (2) Review of medication regiment & side effects (2)  I certify that inpatient services furnished can reasonably be expected to improve the patient's condition.   [name], C   PMHNP-BC 05/06/2013, 2:48 PM
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
## 2013-05-07 NOTE — ED Provider Notes (Signed)
Home health arranged. Mother had patient. Patient has followup plans  Brianna Castillo. Rubin Payor, MD 05/07/13 1500

## 2013-05-07 NOTE — Progress Notes (Signed)
CSW received call from pt mother, stating that envelope with money was left in the safe. Patient mother coming back to ED to check with security. Per discussion with security patient signed off on envelope being given to patient with $60.00. Paper with security waiting for patient family to clear up miscommunication. Per nurse, envelope is very thin and may have been over looked.   Catha Gosselin, LCSWA  [phone] 05/07/2013 1723pm

## 2013-05-07 NOTE — Progress Notes (Signed)
Patient mother to arrive between 57 and 4pm.

## 2013-05-07 NOTE — Progress Notes (Signed)
Pt plan to dc home with pt mother to [place] with home health services through [facility]. Patient pcp will check patient lithium levles and follow up. Patient plans to follow up with [facility] with Yehuda Savannah on 05/09/2013 at 10 am. Patient will also utilitize [place] [facility]. Patient will follow up with all outpatient providers to assist with transition to group home on 05/13/2013.   Catha Gosselin, LCSWA  [phone] .05/07/2013 1519pm

## 2013-05-07 NOTE — ED Notes (Signed)
Charge talked to social work, waiting for pts mother to arrive. Social work tried to call earlier today and did not get in touch of mother.

## 2013-05-07 NOTE — ED Notes (Signed)
[id number]<YN> Expected date:<BR> Expected time:<BR> Means of arrival:<BR> Comments:<BR> Room 26

## 2013-05-07 NOTE — Progress Notes (Signed)
[facility] ED CM received a return call from [place] [place] at 1518 stating Dr Marquita Palms will follow pt for home health services and lithium levels checks when pt returns to [place] area.  CM to fax referral information to Dr Reesa Chew at [phone]  CM attempt to reach Psychiatrist Baloch (found to be a telepsychiatrist in a [place] office through [employer]) at [phone] Cm was referred to [facility] office [phone] ext [phone] Preference is Dr Reesa Chew 1539 provided [name] with referral for [facility] services for home safety check and lithium level checks Orders in EPIC Dr Reesa Chew contact information provided  Pt updated prior to d/c that Brianna Castillo would be following her Pt agreed to services

## 2013-05-07 NOTE — ED Notes (Signed)
Pt's mom is here to pick up patient for discharge.  Spoke to [place], social work prior to to discharge.

## 2013-05-07 NOTE — Progress Notes (Signed)
Consulted with ED SW about d/c plans  1345 Cm spoke with EDP, [place] to obtain orders for [facility] for home safety evaluation and lab draws for lithium level checks q 3 days. Mother states pt seen last by [facility]. Phone: [phone]. Hours: Monday - Friday: 8am - 5pm. Location: [address] [place], [place] [id number] Spoke with Meagan who confirmed pt seen by Dr Reesa Chew Transferred to [place], Medical Assistant [phone] Cm left a voice message for [facility] Left pt name, dob, and reason for call (MD to assist with monitoring pt's lithium level results q 3 days). Requested a return call back to CM.  Pending a return call. ED SW updated

## 2013-05-07 NOTE — Progress Notes (Signed)
ED CM received fax confirmation at 1652 05/07/13 for referral information to Dr Marquita Palms

## 2013-05-07 NOTE — Progress Notes (Signed)
Patient plans to follow up with [facility] with Yehuda Savannah on 05/22/2013 at 10 am. Patient plans to have home health with [facility] for care and to check lithium levels. Patient PCP to follow up with lithium levels and other home health orders. Patient will need to follow up with Dr. Bernette Redbird office, as well as [facility] regarding furture appointments. Patient will receive 1 week worth of medication from [facility] and patient provider will need to call in medications for future prescriptions.   Pt dc pending arrival of patient mother and clarification of PCP and home health. Patient mother to arrive between 330pm and 4pm.   .Catha Gosselin, Theresia Majors  [phone] .05/07/2013 1416pm

## 2013-05-08 NOTE — Progress Notes (Signed)
ED CM left voice message requesting a return call to the pt at the following agencies: [facility] [phone]) and [facility] [phone]) CM left the number [phone] for these agencies to return a call to pt Cm also again encouraged pt to call [facility] Informed [place] of agencies and their contact numbers Cm unable to leave a voice message at these agencies [facility] [phone]) right at home [phone]) and interim [phone]

## 2013-05-08 NOTE — Progress Notes (Signed)
ED SW consulted ED Cm when pt called ED SW inquiring about need to contact [place]  ED CM contacted [name] who reports leaving a message for pt's father and having the sister in law to call her back  Eunice Blase will call pt at mother's home Pt given contact number for the [place] office in [place] as [phone] or the intake number [phone]  Pt left her number as [phone] for a return call

## 2013-05-10 NOTE — Progress Notes (Signed)
ED SW consulted with ED CM about a call from pt stating she was informed by [place] staff that they would not be able to check her lithium level today Pt number to be contacted at today is [phone]  CM contacted the [facility] [place] office and spoke with Olegario Messier who states the pt is receiving care from the [facility] branch at [phone]  Spoke with Marcelino Duster the nursing manager at 1320 who confirms that they will be drawing a lithium level for the pt today (pt will be seen) CM confirmed with michelle the contact information for Dr Marquita Palms (to review the lithium level)   1324 &1329 Attempt to contact pt at  [phone] but the line is busy x [phone] reached pt at [phone] to update her that she will receive lithium check today and Cm spoke with michelle Pt stated "Yay" Pt states the [facility] office staff did call her and she now has their number Pt states she is well and is still planning on her move to the group home on "Monday"

## 2013-05-10 NOTE — Progress Notes (Signed)
[facility] ED CM contacted by ED SW stating a voice message with left by pt with mother in the background stating that they have been informed by [place] that she would have to go to ED to have lithium lab drawn Cm called and spoke with michelle at [facility] branch office at  1454 spoke with mary of [place] [facility] office to inquire about how the initial referral orders were processed and to discuss standard practice for lab draws Pt's home health concerns reviewed with her Corrie Dandy will call the [facility] branch and also provided the information regarding the [facility]  1524 received a return call from Mona of [employer] [facility] office after she spoke with the [facility] office and pt will have the lab drawn and called to  Dr Reesa Chew office by a [facility] RN at the pt's home  1527 Cm received fax confirmation (for face sheet, home health orders with face to face, [facility] progress note, and information for the group home the pt will be going to on 05/13/13) and spoke with Marcelino Duster at [facility] office who confirmed pt will have the lab drawn and called to  Dr Reesa Chew office by a [facility] RN at the pt's home  Cm apologized that the pt only had HHRN ordered not HHSW, PT/OT. Cm and Marcelino Duster discussed having the new lithium level forwarded to Dr Reesa Chew and to group home contact person listed in the [facility] ED Progress notes faxed  1541 Cm called [phone] and left a voice message informing pt Brianna Castillo will be visiting to draw her lithium level

## 2014-06-29 ENCOUNTER — Emergency Department (HOSPITAL_COMMUNITY): Payer: Medicaid Other

## 2014-06-29 ENCOUNTER — Encounter (HOSPITAL_COMMUNITY): Payer: Self-pay | Admitting: Emergency Medicine

## 2014-06-29 ENCOUNTER — Emergency Department (HOSPITAL_COMMUNITY)
Admission: EM | Admit: 2014-06-29 | Discharge: 2014-06-30 | Disposition: A | Discharge status: Home / Self Care | Payer: Medicaid Other | Attending: Emergency Medicine | Admitting: Emergency Medicine

## 2014-06-29 DIAGNOSIS — N390 Urinary tract infection, site not specified: Secondary | ICD-10-CM | POA: Diagnosis not present

## 2014-06-29 DIAGNOSIS — Z79899 Other long term (current) drug therapy: Secondary | ICD-10-CM | POA: Insufficient documentation

## 2014-06-29 DIAGNOSIS — Z8669 Personal history of other diseases of the nervous system and sense organs: Secondary | ICD-10-CM | POA: Diagnosis not present

## 2014-06-29 DIAGNOSIS — K5289 Other specified noninfective gastroenteritis and colitis: Secondary | ICD-10-CM | POA: Insufficient documentation

## 2014-06-29 DIAGNOSIS — R1084 Generalized abdominal pain: Secondary | ICD-10-CM | POA: Insufficient documentation

## 2014-06-29 DIAGNOSIS — K529 Noninfective gastroenteritis and colitis, unspecified: Secondary | ICD-10-CM

## 2014-06-29 DIAGNOSIS — J45909 Unspecified asthma, uncomplicated: Secondary | ICD-10-CM | POA: Diagnosis not present

## 2014-06-29 DIAGNOSIS — Z9089 Acquired absence of other organs: Secondary | ICD-10-CM | POA: Insufficient documentation

## 2014-06-29 DIAGNOSIS — F319 Bipolar disorder, unspecified: Secondary | ICD-10-CM | POA: Insufficient documentation

## 2014-06-29 DIAGNOSIS — R140 Abdominal distension (gaseous): Secondary | ICD-10-CM

## 2014-06-29 HISTORY — DX: Constipation, unspecified: K59.00

## 2014-06-29 MED ORDER — SODIUM CHLORIDE 0.9 % IV BOLUS (SEPSIS)
1000.0000 mL | Freq: Once | INTRAVENOUS | Status: AC
  Administered 2014-06-30: 1000 mL via INTRAVENOUS

## 2014-06-29 MED ORDER — PROMETHAZINE HCL 25 MG/ML IJ SOLN
25.0000 mg | Freq: Once | INTRAMUSCULAR | Status: AC
  Administered 2014-06-30: 25 mg via INTRAVENOUS
  Filled 2014-06-29: qty 1

## 2014-06-29 NOTE — ED Provider Notes (Signed)
CSN: [id number]     Arrival date & time 06/29/14  2256 History   First MD Initiated Contact with Patient 06/29/14 2304     Chief Complaint  Patient presents with  . Abdominal Pain  . Nausea     (Consider location/radiation/quality/duration/timing/severity/associated sxs/prior Treatment) HPI Comments: Patient is a 28 yo F PMHx significant for CP with spastic/ataxic diplegia, bipolar 1 disorder, asthma, pancreatitis, constipation presenting to the ED for three days of abdominal distention, constipation, nausea, vomiting with subjective fevers and chills. Alleviating factors: none. Aggravating factors: movement. Abdominal surgical history includes: appendectomy, cholecystectomy.     Past Medical History  Diagnosis Date  . Asthma   . Bipolar 1 disorder   . Cerebral palsy with spastic/ataxic diplegia     spastic  . Pancreatitis   . Constipation    Past Surgical History  Procedure Laterality Date  . Appendectomy    . Cholecystectomy    . Major lower body reconstruction    . Eye surgery    . Baclofen pump refill      RLQ   No family history on file. History  Substance Use Topics  . Smoking status: Never Smoker   . Smokeless tobacco: Not on file  . Alcohol Use: 0.6 oz/week    1 Shots of liquor per week     Comment: social drinker 1x  a moth    OB History   Grav Para Term Preterm Abortions TAB SAB Ect Mult Living   0 0 0 0 0 0 0 0 0 0      Review of Systems  Constitutional: Positive for fever (subjective) and chills.  Gastrointestinal: Positive for nausea, vomiting, abdominal pain and constipation.  All other systems reviewed and are negative.     Allergies  Methadone hcl; Morphine and related; Trazodone and nefazodone; and Versed  Home Medications   Prior to Admission medications   Medication Sig Start Date End Date Taking? Authorizing Provider  acetaminophen (TYLENOL ARTHRITIS PAIN) 650 MG CR tablet Take 650 mg by mouth every 8 (eight) hours as needed for pain  (pain).   Yes Historical Provider, MD  albuterol (PROVENTIL HFA;VENTOLIN HFA) 108 (90 BASE) MCG/ACT inhaler Inhale 1-2 puffs into the lungs every 6 (six) hours as needed for wheezing. 01/27/13  Yes Linwood DibblesJon Knapp, MD  albuterol (PROVENTIL) (2.5 MG/3ML) 0.083% nebulizer solution Take 2.5 mg by nebulization every 4 (four) hours as needed (cough and wheezing).   Yes Historical Provider, MD  ALPRAZolam Prudy Feeler(XANAX) 0.5 MG tablet Take 0.5 mg by mouth 3 (three) times daily as needed for anxiety (anxiety). Case worker communicating with old facility about all medications   Yes Historical Provider, MD  cyclobenzaprine (FLEXERIL) 5 MG tablet Take 5 mg by mouth 3 (three) times daily as needed for muscle spasms (pain).   Yes Historical Provider, MD  escitalopram (LEXAPRO) 10 MG tablet Take 10 mg by mouth daily.   Yes Historical Provider, MD  gabapentin (NEURONTIN) 300 MG capsule Take 900 mg by mouth 3 (three) times daily.   Yes Historical Provider, MD  hydrOXYzine (ATARAX/VISTARIL) 25 MG tablet Take 25 mg by mouth every 4 (four) hours as needed (anxiety).   Yes Historical Provider, MD  hyoscyamine (ANASPAZ) 0.125 MG TBDP disintergrating tablet Place 0.125 mg under the tongue 3 (three) times daily before meals.   Yes Historical Provider, MD  lithium 300 MG tablet Take 1,200 mg by mouth at bedtime. Take 4 tablets at bedtime   Yes Historical Provider, MD  meclizine (ANTIVERT) 25  MG tablet Take 25 mg by mouth 3 (three) times daily as needed for dizziness (dizziness).   Yes Historical Provider, MD  OLANZapine (ZYPREXA) 10 MG tablet Take 10 mg by mouth at bedtime.   Yes Historical Provider, MD  oxyCODONE (OXY IR/ROXICODONE) 5 MG immediate release tablet Take 5 mg by mouth every 6 (six) hours as needed for severe pain (pain).   Yes Historical Provider, MD  phenylephrine (SUDAFED PE) 10 MG TABS tablet Take 10 mg by mouth every 4 (four) hours as needed (congestion).   Yes Historical Provider, MD  polyethylene glycol (MIRALAX /  GLYCOLAX) packet Take 17 g by mouth 2 (two) times daily.   Yes Historical Provider, MD  potassium chloride (MICRO-K) 10 MEQ CR capsule Take 20 mEq by mouth 2 (two) times daily.   Yes Historical Provider, MD  triamcinolone (KENALOG) 0.025 % cream Apply 1 application topically 2 (two) times daily. Uses on spot on bottom   Yes Historical Provider, MD  Vitamins A & D (VITAMIN A & D) ointment Apply 1 application topically as needed for dry skin (Use on bottom).   Yes Historical Provider, MD   BP 118/84  Pulse 115  Temp(Src) 98 F (36.7 C) (Oral)  Resp 22  SpO2 95% Physical Exam  Nursing note and vitals reviewed. Constitutional: She is oriented to person, place, and time. She appears well-developed and well-nourished. No distress.  HENT:  Head: Normocephalic and atraumatic.  Right Ear: External ear normal.  Left Ear: External ear normal.  Nose: Nose normal.  Mouth/Throat: Oropharynx is clear and moist. No oropharyngeal exudate.  Eyes: Conjunctivae are normal.  Neck: Neck supple.  Cardiovascular: Normal rate, regular rhythm and normal heart sounds.   Pulmonary/Chest: Effort normal and breath sounds normal. No respiratory distress.  Abdominal: Soft. Normal appearance and bowel sounds are normal. She exhibits distension. There is generalized tenderness. There is no rigidity, no rebound and no guarding.  Neurological: She is alert and oriented to person, place, and time.  Skin: Skin is warm and dry. She is not diaphoretic.    ED Course  Procedures (including critical care time) Medications  promethazine (PHENERGAN) injection 25 mg (not administered)  sodium chloride 0.9 % bolus 1,000 mL (1,000 mLs Intravenous New Bag/Given 06/30/14 0039)  iohexol (OMNIPAQUE) 300 MG/ML solution 50 mL (50 mLs Oral Contrast Given 06/30/14 0034)    Labs Review Labs Reviewed  CBC WITH DIFFERENTIAL - Abnormal; Notable for the following:    WBC 23.7 (*)    MCH 25.2 (*)    RDW 16.8 (*)    Neutrophils Relative %  78 (*)    Neutro Abs 18.3 (*)    Monocytes Absolute 1.1 (*)    All other components within normal limits  COMPREHENSIVE METABOLIC PANEL  LIPASE, BLOOD  PREGNANCY, URINE  URINALYSIS, ROUTINE W REFLEX MICROSCOPIC    Imaging Review Dg Abd 2 Views  06/29/2014   CLINICAL DATA:  Abdominal pain, nausea  EXAM: ABDOMEN - 2 VIEW  COMPARISON:  None.  FINDINGS: Multiple dilated gaseous loops of bowel are seen scattered throughout the abdomen, measuring up to 6.4 cm in diameter. No air-fluid levels. Findings are favored to reflect ileus rather than obstruction. No free intraperitoneal air seen on lateral decubitus image. No abnormal bowel wall thickening.  No soft tissue mass or abnormal calcification. Cholecystectomy clips noted.  Generator from spinal stimulator device overlies the right flank. Osseous structures within normal limits.  Visualized lung bases are clear.  IMPRESSION: Multiple dilated gaseous filled  loops of bowel scattered throughout the abdomen, favored to reflect functional ileus. No free intraperitoneal air.   Electronically Signed   By: Rise Mu M.D.   On: 06/29/2014 23:49     EKG Interpretation None      MDM   Final diagnoses:  Abdominal distention   Filed Vitals:   06/29/14 2256  BP: 118/84  Pulse: 115  Temp: 98 F (36.7 C)  Resp: 22   Afebrile, NAD, non-toxic appearing, AAOx4.  Abdomen is soft, generalized abdominal pain with distention. BS present. Patient requesting only nausea medications at this time. AXR concerning for ileus, given symptoms clinical suspicion is still high for SBO. Labs and CT abdomen/pelvis with contrast will be obtained. Signed out to [employer], [employer]. Patient d/w with Dr. Jeraldine Loots, agrees with plan.      Jeannetta Ellis, PA-C 06/30/14 [phone]

## 2014-06-29 NOTE — ED Notes (Signed)
Patient is from group home. Patient c/o abdominal pain and n/v. Patient states this began 2-3 hours ago. Reports not having BM in 3 days. Per EMS pt abdomin is very distended. Denies taking any medication for this problem.

## 2014-06-30 ENCOUNTER — Encounter (HOSPITAL_COMMUNITY): Payer: Self-pay

## 2014-06-30 ENCOUNTER — Emergency Department (HOSPITAL_COMMUNITY): Payer: Medicaid Other

## 2014-06-30 LAB — CBC WITH DIFFERENTIAL/PLATELET
BASOS ABS: 0 10*3/uL (ref 0.0–0.1)
BASOS PCT: 0 % (ref 0–1)
EOS PCT: 2 % (ref 0–5)
Eosinophils Absolute: 0.5 10*3/uL (ref 0.0–0.7)
HEMATOCRIT: 39.3 % (ref 36.0–46.0)
Hemoglobin: 12.4 g/dL (ref 12.0–15.0)
Lymphocytes Relative: 15 % (ref 12–46)
Lymphs Abs: 3.7 10*3/uL (ref 0.7–4.0)
MCH: 25.2 pg — ABNORMAL LOW (ref 26.0–34.0)
MCHC: 31.6 g/dL (ref 30.0–36.0)
MCV: 79.7 fL (ref 78.0–100.0)
MONO ABS: 1.1 10*3/uL — AB (ref 0.1–1.0)
Monocytes Relative: 5 % (ref 3–12)
NEUTROS ABS: 18.3 10*3/uL — AB (ref 1.7–7.7)
Neutrophils Relative %: 78 % — ABNORMAL HIGH (ref 43–77)
Platelets: 372 10*3/uL (ref 150–400)
RBC: 4.93 MIL/uL (ref 3.87–5.11)
RDW: 16.8 % — AB (ref 11.5–15.5)
WBC: 23.7 10*3/uL — ABNORMAL HIGH (ref 4.0–10.5)

## 2014-06-30 LAB — COMPREHENSIVE METABOLIC PANEL
ALBUMIN: 3.7 g/dL (ref 3.5–5.2)
ALT: 15 U/L (ref 0–35)
AST: 13 U/L (ref 0–37)
Alkaline Phosphatase: 119 U/L — ABNORMAL HIGH (ref 39–117)
Anion gap: 15 (ref 5–15)
BUN: 15 mg/dL (ref 6–23)
CALCIUM: 9.9 mg/dL (ref 8.4–10.5)
CO2: 22 mEq/L (ref 19–32)
CREATININE: 0.52 mg/dL (ref 0.50–1.10)
Chloride: 103 mEq/L (ref 96–112)
GFR calc Af Amer: >90 mL/min (ref >90)
GFR calc non Af Amer: >90 mL/min (ref >90)
Glucose, Bld: 125 mg/dL — ABNORMAL HIGH (ref 70–99)
Potassium: 4.3 mEq/L (ref 3.7–5.3)
Sodium: 140 mEq/L (ref 137–147)
TOTAL PROTEIN: 7.7 g/dL (ref 6.0–8.3)
Total Bilirubin: 0.2 mg/dL — ABNORMAL LOW (ref 0.3–1.2)

## 2014-06-30 LAB — URINALYSIS, ROUTINE W REFLEX MICROSCOPIC
BILIRUBIN URINE: NEGATIVE
Glucose, UA: NEGATIVE mg/dL
Ketones, ur: NEGATIVE mg/dL
NITRITE: POSITIVE — AB
Protein, ur: NEGATIVE mg/dL
SPECIFIC GRAVITY, URINE: 1.012 (ref 1.005–1.030)
UROBILINOGEN UA: 0.2 mg/dL (ref 0.0–1.0)
pH: 6.5 (ref 5.0–8.0)

## 2014-06-30 LAB — URINE MICROSCOPIC-ADD ON

## 2014-06-30 LAB — I-STAT CG4 LACTIC ACID, ED: Lactic Acid, Venous: 0.74 mmol/L (ref 0.5–2.2)

## 2014-06-30 LAB — LIPASE, BLOOD: LIPASE: 17 U/L (ref 11–59)

## 2014-06-30 LAB — PREGNANCY, URINE: PREG TEST UR: NEGATIVE

## 2014-06-30 MED ORDER — IOHEXOL 300 MG/ML  SOLN
50.0000 mL | Freq: Once | INTRAMUSCULAR | Status: AC | PRN
  Administered 2014-06-30: 50 mL via ORAL

## 2014-06-30 MED ORDER — POLYETHYLENE GLYCOL 3350 17 GM/SCOOP PO POWD: 17.0000 g | Freq: Every day | ORAL | Status: DC

## 2014-06-30 MED ORDER — METRONIDAZOLE 500 MG PO TABS: 500.0000 mg | ORAL_TABLET | Freq: Two times a day (BID) | ORAL | Status: DC

## 2014-06-30 MED ORDER — IOHEXOL 300 MG/ML  SOLN
100.0000 mL | Freq: Once | INTRAMUSCULAR | Status: AC | PRN
  Administered 2014-06-30: 100 mL via INTRAVENOUS

## 2014-06-30 MED ORDER — CIPROFLOXACIN HCL 500 MG PO TABS: 500.0000 mg | ORAL_TABLET | Freq: Two times a day (BID) | ORAL | Status: DC

## 2014-06-30 MED ORDER — DEXTROSE 5 % IV SOLN
1.0000 g | Freq: Once | INTRAVENOUS | Status: AC
  Administered 2014-06-30: 1 g via INTRAVENOUS
  Filled 2014-06-30: qty 10

## 2014-06-30 NOTE — ED Notes (Signed)
Call and report attempt tyo e made to group home.  Voice message left.  Will attempt another call inanhour

## 2014-06-30 NOTE — ED Provider Notes (Signed)
  This was a shared visit with a mid-level provided (NP or PA).  Throughout the patient's course I was available for consultation/collaboration.  I saw the ECG (if appropriate), relevant labs and studies - I agree with the interpretation.  On my exam the patient was in no distress.  However she continued to complain of abdominal discomfort.  Patient's evaluation demonstrated urinary tract infection, enteritis. Patient remained hemodynamically stable throughout.       Gerhard Munchobert Reita Shindler, MD 06/30/14 [phone]

## 2014-06-30 NOTE — ED Provider Notes (Signed)
0330 - Patient care assumed from [facility], PA-C at shift change. Labs and CT imaging for further evaluation of abdominal pain, N/V, and subjective fever/chills. UA today suggest infection for which patient has been tx with IV Rocephin while in ED. Leukocytosis also noted to be 23.7. This may be secondary to UTI, though CT today does also show enteritis. As this may be infectious, will cover patient with Cipro and Flagyl. UA sent for culture; UTI at this time will be managed also with Cipro dosing. Lactate normal, kidney and liver function preserved. No evidence of SBO on CT scan.  Patient stable and appropriate for discharge. Will prescribe Miralax to promote bowel movements. PCP f/u advised in 48 hours for recheck. Return precautions provided. Patient discharged in good condition.   Filed Vitals:   06/29/14 2256 06/30/14 0245  BP: 118/84   Pulse: 115 96  Temp: 98 F (36.7 C)   TempSrc: Oral   Resp: 22 17  SpO2: 95% 95%    Results for orders placed during the hospital encounter of 06/29/14  CBC WITH DIFFERENTIAL      Result Value Ref Range   WBC 23.7 (*) 4.0 - 10.5 K/uL   RBC 4.93  3.87 - 5.11 MIL/uL   Hemoglobin 12.4  12.0 - 15.0 g/dL   HCT 16.1  09.6 - 04.5 %   MCV 79.7  78.0 - 100.0 fL   MCH 25.2 (*) 26.0 - 34.0 pg   MCHC 31.6  30.0 - 36.0 g/dL   RDW 40.9 (*) 81.1 - 91.4 %   Platelets 372  150 - 400 K/uL   Neutrophils Relative % 78 (*) 43 - 77 %   Neutro Abs 18.3 (*) 1.7 - 7.7 K/uL   Lymphocytes Relative 15  12 - 46 %   Lymphs Abs 3.7  0.7 - 4.0 K/uL   Monocytes Relative 5  3 - 12 %   Monocytes Absolute 1.1 (*) 0.1 - 1.0 K/uL   Eosinophils Relative 2  0 - 5 %   Eosinophils Absolute 0.5  0.0 - 0.7 K/uL   Basophils Relative 0  0 - 1 %   Basophils Absolute 0.0  0.0 - 0.1 K/uL  COMPREHENSIVE METABOLIC PANEL      Result Value Ref Range   Sodium 140  137 - 147 mEq/L   Potassium 4.3  3.7 - 5.3 mEq/L   Chloride 103  96 - 112 mEq/L   CO2 22  19 - 32 mEq/L   Glucose, Bld  125 (*) 70 - 99 mg/dL   BUN 15  6 - 23 mg/dL   Creatinine, Ser 7.82  0.50 - 1.10 mg/dL   Calcium 9.9  8.4 - 95.6 mg/dL   Total Protein 7.7  6.0 - 8.3 g/dL   Albumin 3.7  3.5 - 5.2 g/dL   AST 13  0 - 37 U/L   ALT 15  0 - 35 U/L   Alkaline Phosphatase 119 (*) 39 - 117 U/L   Total Bilirubin 0.2 (*) 0.3 - 1.2 mg/dL   GFR calc non Af Amer >90  >90 mL/min   GFR calc Af Amer >90  >90 mL/min   Anion gap 15  5 - 15  LIPASE, BLOOD      Result Value Ref Range   Lipase 17  11 - 59 U/L  PREGNANCY, URINE      Result Value Ref Range   Preg Test, Ur NEGATIVE  NEGATIVE  URINALYSIS, ROUTINE W REFLEX MICROSCOPIC  Result Value Ref Range   Color, Urine YELLOW  YELLOW   APPearance CLOUDY (*) CLEAR   Specific Gravity, Urine 1.012  1.005 - 1.030   pH 6.5  5.0 - 8.0   Glucose, UA NEGATIVE  NEGATIVE mg/dL   Hgb urine dipstick TRACE (*) NEGATIVE   Bilirubin Urine NEGATIVE  NEGATIVE   Ketones, ur NEGATIVE  NEGATIVE mg/dL   Protein, ur NEGATIVE  NEGATIVE mg/dL   Urobilinogen, UA 0.2  0.0 - 1.0 mg/dL   Nitrite POSITIVE (*) NEGATIVE   Leukocytes, UA MODERATE (*) NEGATIVE  URINE MICROSCOPIC-ADD ON      Result Value Ref Range   Squamous Epithelial / LPF RARE  RARE   WBC, UA 11-20  <3 WBC/hpf   RBC / HPF 3-6  <3 RBC/hpf   Bacteria, UA MANY (*) RARE   Urine-Other FEW YEAST    I-STAT CG4 LACTIC ACID, ED      Result Value Ref Range   Lactic Acid, Venous 0.74  0.5 - 2.2 mmol/L   Ct Abdomen Pelvis W Contrast  06/30/2014   CLINICAL DATA:  ABDOMINAL PAIN NAUSEA  EXAM: CT ABDOMEN AND PELVIS WITH CONTRAST  TECHNIQUE: Multidetector CT imaging of the abdomen and pelvis was performed using the standard protocol following bolus administration of intravenous contrast.  CONTRAST:  OMNIPAQUE IOHEXOL 300 MG/ML  SOLN  COMPARISON:  Prior radiograph performed earlier on the same day.  FINDINGS: The visualized lung bases are clear.  The liver demonstrates a normal contrast enhanced appearance. Gallbladder is absent.  No biliary ductal dilatation. The spleen, adrenal glands, and pancreas demonstrate a normal contrast enhanced appearance.  Kidneys are equal in size with symmetric enhancement. Subcentimeter hypodense lesion extending from the posterior aspect the left kidney is too small the characterize by CT, but statistically likely represents a small cyst. No nephrolithiasis, hydronephrosis, or focal enhancing renal mass. No hydroureter.  Stomach within normal limits. Small bowel is of normal caliber without evidence of obstruction. There is mild circumferential wall thickening with hazy inflammatory stranding about several loops of bowel within the lower mid abdomen (series 4, image 52). There is a small amount of adjacent free fluid. Findings are nonspecific, but may represent underlying enteritis. The colon is mildly prominent with intraluminal fluid present throughout. No abnormal wall thickening or inflammatory changes seen about the colon itself. Suture material present about the cecum likely related to prior appendectomy.  Bladder is within normal limits. Uterus and right ovary are unremarkable. Probable small physiologic cyst noted within the left ovary.  No free air or fluid. Normal intravascular enhancement seen throughout the abdomen and pelvis. No pathologically enlarged intra-abdominal pelvic lymph nodes.  There is dye stasis of the rectus abdominis musculature. Generator spinal baclofen pump present.  No acute osseous abnormality. No worrisome lytic or blastic osseous lesions.  IMPRESSION: 1. Mild mucosal enhancement with inflammatory stranding about several loops of distal small bowel within the mid -right lower abdomen, nonspecific, but suggestive of possible acute enteritis. No associated small bowel obstruction. 2. Mild diffuse distension of the colon without associated inflammatory changes. 3. No other acute intra-abdominal pelvic process. 4. Status post cholecystectomy and appendectomy.   Electronically  Signed   By: Rise Mu M.D.   On: 06/30/2014 02:14   Dg Abd 2 Views  06/29/2014   CLINICAL DATA:  Abdominal pain, nausea  EXAM: ABDOMEN - 2 VIEW  COMPARISON:  None.  FINDINGS: Multiple dilated gaseous loops of bowel are seen scattered throughout the abdomen, measuring  up to 6.4 cm in diameter. No air-fluid levels. Findings are favored to reflect ileus rather than obstruction. No free intraperitoneal air seen on lateral decubitus image. No abnormal bowel wall thickening.  No soft tissue mass or abnormal calcification. Cholecystectomy clips noted.  Generator from spinal stimulator device overlies the right flank. Osseous structures within normal limits.  Visualized lung bases are clear.  IMPRESSION: Multiple dilated gaseous filled loops of bowel scattered throughout the abdomen, favored to reflect functional ileus. No free intraperitoneal air.   Electronically Signed   By: Rise MuBenjamin  McClintock M.D.   On: 06/29/2014 23:49      Antony MaduraKelly Sadrac Zeoli, PA-C 06/30/14 [id number]

## 2014-06-30 NOTE — ED Notes (Signed)
CONTACT INFORMATION ADMINISTRATOR WITH AGENCY FOR THIS PT. Brianna Castillo [phone]

## 2014-06-30 NOTE — Discharge Instructions (Signed)
Your CT scan showed enteritis which is an inflammation of your small bowel. This may be infectious in nature for which you should take ciprofloxacin and Flagyl. Do not drink alcohol while taking Flagyl as this will make you violently ill, vomiting. Your urine today also suggested a urinary tract infection. Ciprofloxacin will treat your urinary tract infection. Take MiraLax as prescribed for constipation and to promote bowel movements. Followup with your primary care provider to ensure that symptoms resolved.  Abdominal Pain Many things can cause abdominal pain. Usually, abdominal pain is not caused by a disease and will improve without treatment. It can often be observed and treated at home. Your health care provider will do a physical exam and possibly order blood tests and X-rays to help determine the seriousness of your pain. However, in many cases, more time must pass before a clear cause of the pain can be found. Before that point, your health care provider may not know if you need more testing or further treatment. HOME CARE INSTRUCTIONS  Monitor your abdominal pain for any changes. The following actions may help to alleviate any discomfort you are experiencing:  Only take over-the-counter or prescription medicines as directed by your health care provider.  Do not take laxatives unless directed to do so by your health care provider.  Try a clear liquid diet (broth, tea, or water) as directed by your health care provider. Slowly move to a bland diet as tolerated. SEEK MEDICAL CARE IF:  You have unexplained abdominal pain.  You have abdominal pain associated with nausea or diarrhea.  You have pain when you urinate or have a bowel movement.  You experience abdominal pain that wakes you in the night.  You have abdominal pain that is worsened or improved by eating food.  You have abdominal pain that is worsened with eating fatty foods.  You have a fever. SEEK IMMEDIATE MEDICAL CARE IF:     Your pain does not go away within 2 hours.  You keep throwing up (vomiting).  Your pain is felt only in portions of the abdomen, such as the right side or the left lower portion of the abdomen.  You pass bloody or black tarry stools. MAKE SURE YOU:  Understand these instructions.   Will watch your condition.   Will get help right away if you are not doing well or get worse.  Document Released: 08/24/2005 Document Revised: 11/19/2013 Document Reviewed: 07/24/2013 [facility] Patient Information 2015 [place], [place]. This information is not intended to replace advice given to you by your health care provider. Make sure you discuss any questions you have with your health care provider. Urinary Tract Infection Urinary tract infections (UTIs) can develop anywhere along your urinary tract. Your urinary tract is your body's drainage system for removing wastes and extra water. Your urinary tract includes two kidneys, two ureters, a bladder, and a urethra. Your kidneys are a pair of bean-shaped organs. Each kidney is about the size of your fist. They are located below your ribs, one on each side of your spine. CAUSES Infections are caused by microbes, which are microscopic organisms, including fungi, viruses, and bacteria. These organisms are so small that they can only be seen through a microscope. Bacteria are the microbes that most commonly cause UTIs. SYMPTOMS  Symptoms of UTIs may vary by age and gender of the patient and by the location of the infection. Symptoms in young women typically include a frequent and intense urge to urinate and a painful, burning feeling  in the bladder or urethra during urination. Older women and men are more likely to be tired, shaky, and weak and have muscle aches and abdominal pain. A fever may mean the infection is in your kidneys. Other symptoms of a kidney infection include pain in your back or sides below the ribs, nausea, and vomiting. DIAGNOSIS To diagnose  a UTI, your caregiver will ask you about your symptoms. Your caregiver also will ask to provide a urine sample. The urine sample will be tested for bacteria and white blood cells. White blood cells are made by your body to help fight infection. TREATMENT  Typically, UTIs can be treated with medication. Because most UTIs are caused by a bacterial infection, they usually can be treated with the use of antibiotics. The choice of antibiotic and length of treatment depend on your symptoms and the type of bacteria causing your infection. HOME CARE INSTRUCTIONS  If you were prescribed antibiotics, take them exactly as your caregiver instructs you. Finish the medication even if you feel better after you have only taken some of the medication.  Drink enough water and fluids to keep your urine clear or pale yellow.  Avoid caffeine, tea, and carbonated beverages. They tend to irritate your bladder.  Empty your bladder often. Avoid holding urine for long periods of time.  Empty your bladder before and after sexual intercourse.  After a bowel movement, women should cleanse from front to back. Use each tissue only once. SEEK MEDICAL CARE IF:   You have back pain.  You develop a fever.  Your symptoms do not begin to resolve within 3 days. SEEK IMMEDIATE MEDICAL CARE IF:   You have severe back pain or lower abdominal pain.  You develop chills.  You have nausea or vomiting.  You have continued burning or discomfort with urination. MAKE SURE YOU:   Understand these instructions.  Will watch your condition.  Will get help right away if you are not doing well or get worse. Document Released: 08/24/2005 Document Revised: 05/15/2012 Document Reviewed: 12/23/2011 [facility] Patient Information 2015 [place], [place]. This information is not intended to replace advice given to you by your health care provider. Make sure you discuss any questions you have with your health care provider.

## 2014-06-30 NOTE — ED Notes (Signed)
Patient is from [facility], the address is: [address] [place], [place]

## 2014-06-30 NOTE — ED Notes (Signed)
PT NEEDS ASSISTANCE WITH SELF CATH EVERY 4 HOURS

## 2014-06-30 NOTE — ED Provider Notes (Signed)
Please see the addendum to my original note.  Gerhard Munchobert Maureen Delatte, MD 06/30/14 [phone]

## 2014-07-03 ENCOUNTER — Telehealth (HOSPITAL_BASED_OUTPATIENT_CLINIC_OR_DEPARTMENT_OTHER): Payer: Self-pay | Admitting: Emergency Medicine

## 2014-07-03 LAB — URINE CULTURE: Colony Count: >=100000

## 2014-07-03 NOTE — Telephone Encounter (Signed)
Post ED Visit - Positive Culture Follow-up: Successful Patient Follow-Up  Culture assessed and recommendations reviewed by: []  Wes Dulaney, Pharm.D., BCPS []  Celedonio MiyamotoJeremy Frens, Pharm.D., BCPS []  Georgina PillionElizabeth Martin, Pharm.D., BCPS []  TaconiteMinh Pham, [place]., BCPS, AAHIVP []  Estella HuskMichelle Turner, Pharm.D., BCPS, AAHIVP []  Red ChristiansSamson Lee, Pharm.D. [x]  Cassie WestfordStewart, [place]arm.D.  Positive urine culture >100,000 VRE  []  Patient discharged without antimicrobial prescription and treatment is now indicated [x]  Organism is resistant to prescribed ED discharge antimicrobial []  Patient with positive blood cultures  Changes discussed with ED provider: Meredith StaggersJeffrey Greene [facility] New antibiotic prescription Fosfomycin 3 grams x 2 doses separated by 2-3 days [facility]   [facility], [facility] , [facility]  Left message for pt to return call  Berle MullMiller, Halaina Vanduzer 07/03/2014, 3:48 PM

## 2014-07-03 NOTE — Telephone Encounter (Signed)
Unable to contact patient via phone. Sent letter. °

## 2014-07-03 NOTE — Progress Notes (Signed)
ED Antimicrobial Stewardship Positive Culture Follow Up   Brianna Castillo is an 28 y.o. female who presented to [facility] on 06/[facility]2014 with a chief complaint of  Chief Complaint  Patient presents with  . Abdominal Pain  . Nausea    Recent Results (from the past 720 hour(s))  URINE CULTURE     Status: None   Collection Time    06/30/14  2:37 AM      Result Value Ref Range Status   Specimen Description URINE, RANDOM   Final   Special Requests NONE   Final   Culture  Setup Time     Final   Value: 06/30/2014 08:38     Performed at [facility]   Colony Count     Final   Value: >=100,000 COLONIES/ML     Performed at [facility]   Culture     Final   Value: VANCOMYCIN RESISTANT ENTEROCOCCUS ISOLATED     Note: CRITICAL RESULT CALLED TO, READ BACK BY AND VERIFIED WITH: LINN MILLER @ 7:43AM 07/03/14 BY DWEEKS     Performed at [facility]   Report Status 07/03/2014 FINAL   Final   Organism ID, Bacteria VANCOMYCIN RESISTANT ENTEROCOCCUS ISOLATED   Final    [x]  Treated with ciprofloxacin 500 mg PO BID #20 tablets and metronidazole 500 mg PO BID #20 tablets, but 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

## 2014-07-31 ENCOUNTER — Telehealth (HOSPITAL_COMMUNITY): Payer: Self-pay

## 2014-07-31 NOTE — ED Notes (Signed)
Unable to contact pt by mail or telephone. Unable to communicate lab results or treatment changes. 

## 2014-08-17 ENCOUNTER — Encounter (HOSPITAL_COMMUNITY): Payer: Self-pay | Admitting: Emergency Medicine

## 2014-08-17 ENCOUNTER — Emergency Department (HOSPITAL_COMMUNITY)
Admission: EM | Admit: 2014-08-17 | Discharge: 2014-08-17 | Disposition: A | Discharge status: Home / Self Care | Payer: Medicaid Other | Attending: Emergency Medicine | Admitting: Emergency Medicine

## 2014-08-17 DIAGNOSIS — K5900 Constipation, unspecified: Secondary | ICD-10-CM | POA: Insufficient documentation

## 2014-08-17 DIAGNOSIS — N10 Acute tubulo-interstitial nephritis: Secondary | ICD-10-CM | POA: Diagnosis not present

## 2014-08-17 DIAGNOSIS — G808 Other cerebral palsy: Secondary | ICD-10-CM | POA: Insufficient documentation

## 2014-08-17 DIAGNOSIS — F319 Bipolar disorder, unspecified: Secondary | ICD-10-CM | POA: Diagnosis not present

## 2014-08-17 DIAGNOSIS — J45909 Unspecified asthma, uncomplicated: Secondary | ICD-10-CM | POA: Insufficient documentation

## 2014-08-17 DIAGNOSIS — R000 Tachycardia, unspecified: Secondary | ICD-10-CM | POA: Diagnosis not present

## 2014-08-17 DIAGNOSIS — M546 Pain in thoracic spine: Secondary | ICD-10-CM | POA: Insufficient documentation

## 2014-08-17 DIAGNOSIS — Z79899 Other long term (current) drug therapy: Secondary | ICD-10-CM | POA: Insufficient documentation

## 2014-08-17 LAB — CBC WITH DIFFERENTIAL/PLATELET
Basophils Absolute: 0.1 10*3/uL (ref 0.0–0.1)
Basophils Relative: 0 % (ref 0–1)
Eosinophils Absolute: 0.5 10*3/uL (ref 0.0–0.7)
Eosinophils Relative: 4 % (ref 0–5)
HCT: 40.6 % (ref 36.0–46.0)
HEMOGLOBIN: 12.7 g/dL (ref 12.0–15.0)
LYMPHS ABS: 3 10*3/uL (ref 0.7–4.0)
Lymphocytes Relative: 22 % (ref 12–46)
MCH: 25.6 pg — ABNORMAL LOW (ref 26.0–34.0)
MCHC: 31.3 g/dL (ref 30.0–36.0)
MCV: 81.7 fL (ref 78.0–100.0)
MONOS PCT: 5 % (ref 3–12)
Monocytes Absolute: 0.7 10*3/uL (ref 0.1–1.0)
NEUTROS ABS: 9.5 10*3/uL — AB (ref 1.7–7.7)
Neutrophils Relative %: 69 % (ref 43–77)
Platelets: 353 10*3/uL (ref 150–400)
RBC: 4.97 MIL/uL (ref 3.87–5.11)
RDW: 16.4 % — ABNORMAL HIGH (ref 11.5–15.5)
WBC: 13.8 10*3/uL — ABNORMAL HIGH (ref 4.0–10.5)

## 2014-08-17 LAB — BASIC METABOLIC PANEL
Anion gap: 14 (ref 5–15)
BUN: 7 mg/dL (ref 6–23)
CHLORIDE: 108 meq/L (ref 96–112)
CO2: 21 mEq/L (ref 19–32)
CREATININE: 0.35 mg/dL — AB (ref 0.50–1.10)
Calcium: 9.5 mg/dL (ref 8.4–10.5)
GFR calc Af Amer: >90 mL/min (ref >90)
GFR calc non Af Amer: >90 mL/min (ref >90)
GLUCOSE: 101 mg/dL — AB (ref 70–99)
POTASSIUM: 4.8 meq/L (ref 3.7–5.3)
Sodium: 143 mEq/L (ref 137–147)

## 2014-08-17 LAB — URINALYSIS, ROUTINE W REFLEX MICROSCOPIC
BILIRUBIN URINE: NEGATIVE
GLUCOSE, UA: NEGATIVE mg/dL
Hgb urine dipstick: NEGATIVE
Ketones, ur: NEGATIVE mg/dL
Nitrite: NEGATIVE
PH: 7.5 (ref 5.0–8.0)
Protein, ur: NEGATIVE mg/dL
SPECIFIC GRAVITY, URINE: 1.009 (ref 1.005–1.030)
Urobilinogen, UA: 0.2 mg/dL (ref 0.0–1.0)

## 2014-08-17 LAB — URINE MICROSCOPIC-ADD ON

## 2014-08-17 MED ORDER — SODIUM CHLORIDE 0.9 % IV BOLUS (SEPSIS)
1000.0000 mL | Freq: Once | INTRAVENOUS | Status: AC
  Administered 2014-08-17: 1000 mL via INTRAVENOUS

## 2014-08-17 MED ORDER — DEXTROSE 5 % IV SOLN
1.0000 g | Freq: Once | INTRAVENOUS | Status: AC
  Administered 2014-08-17: 1 g via INTRAVENOUS
  Filled 2014-08-17: qty 10

## 2014-08-17 MED ORDER — HYDROMORPHONE HCL 1 MG/ML IJ SOLN
1.0000 mg | Freq: Once | INTRAMUSCULAR | Status: AC
  Administered 2014-08-17: 1 mg via INTRAVENOUS
  Filled 2014-08-17 (×2): qty 1

## 2014-08-17 MED ORDER — MORPHINE SULFATE 4 MG/ML IJ SOLN: 4.0000 mg | Freq: Once | INTRAMUSCULAR | Status: DC

## 2014-08-17 MED ORDER — ONDANSETRON HCL 4 MG/2ML IJ SOLN
4.0000 mg | Freq: Once | INTRAMUSCULAR | Status: AC
  Administered 2014-08-17: 4 mg via INTRAVENOUS
  Filled 2014-08-17 (×2): qty 2

## 2014-08-17 MED ORDER — CEPHALEXIN 500 MG PO CAPS: 500.0000 mg | ORAL_CAPSULE | Freq: Four times a day (QID) | ORAL | Status: DC

## 2014-08-17 NOTE — ED Notes (Signed)
Attempted IV access, unsuccessful. Will have another RN attempt with ultrasound.

## 2014-08-17 NOTE — ED Notes (Signed)
Add on for urine culture sent to main lab

## 2014-08-17 NOTE — Discharge Instructions (Signed)
Take keflex as directed until gone for your pyelonephritis. Refer to attached documents for more information. Follow up with your doctor for further evaluation.

## 2014-08-17 NOTE — ED Notes (Signed)
IV team at bedside 

## 2014-08-17 NOTE — ED Notes (Signed)
Per [facility], pt has hx of cerebral palsy and is wheelchair bound. She has had increasing back pain for 2-3 weeks. Pt states the pain has now gotten unbearable.

## 2014-08-17 NOTE — ED Provider Notes (Signed)
CSN: [id number]     Arrival date & time 08/17/14  1213 History   First MD Initiated Contact with Patient 08/17/14 1229     Chief Complaint  Patient presents with  . Back Pain     (Consider location/radiation/quality/duration/timing/severity/associated sxs/prior Treatment) HPI Comments: Patient is a 28 year old female with a past medical history of cerebral palsy with spastic/ataxic diplegia, bipolar 1 disorder, asthma, and constipation who presents with a 3 week history of back pain that is progressively worsening. The pain is aching and severe and located in her mid back. The pain radiates down her back into her legs. Movement and palpation makes the pain worsen. Patient takes oxycodone for pain and also has a Baclofen pump. No other associated symptoms. No alleviating factors.    Past Medical History  Diagnosis Date  . Asthma   . Bipolar 1 disorder   . Cerebral palsy with spastic/ataxic diplegia     spastic  . Pancreatitis   . Constipation    Past Surgical History  Procedure Laterality Date  . Appendectomy    . Cholecystectomy    . Major lower body reconstruction    . Eye surgery    . Baclofen pump refill      RLQ  . Urinary diversion     History reviewed. No pertinent family history. History  Substance Use Topics  . Smoking status: Never Smoker   . Smokeless tobacco: Not on file  . Alcohol Use: 0.6 oz/week    1 Shots of liquor per week     Comment: social drinker 1x  a moth    OB History   Grav Para Term Preterm Abortions TAB SAB Ect Mult Living       Review of Systems  Constitutional: Negative for fever, chills and fatigue.  HENT: Negative for trouble swallowing.   Eyes: Negative for visual disturbance.  Respiratory: Negative for shortness of breath.   Cardiovascular: Negative for chest pain and palpitations.  Gastrointestinal: Negative for nausea, vomiting, abdominal pain and diarrhea.  Genitourinary: Negative for dysuria and difficulty  urinating.  Musculoskeletal: Positive for back pain. Negative for arthralgias and neck pain.  Skin: Negative for color change.  Neurological: Negative for dizziness and weakness.  Psychiatric/Behavioral: Negative for dysphoric mood.      Allergies  Methadone hcl; Morphine and related; Trazodone and nefazodone; and Versed  Home Medications   Prior to Admission medications   Medication Sig Start Date End Date Taking? Authorizing Provider  albuterol (PROVENTIL HFA;VENTOLIN HFA) 108 (90 BASE) MCG/ACT inhaler Inhale 1-2 puffs into the lungs every 6 (six) hours as needed for wheezing. 01/27/13  Yes Linwood Dibbles, MD  cyclobenzaprine (FLEXERIL) 5 MG tablet Take 5 mg by mouth 3 (three) times daily as needed for muscle spasms (pain).   Yes Historical Provider, MD  escitalopram (LEXAPRO) 10 MG tablet Take 10 mg by mouth daily.   Yes Historical Provider, MD  gabapentin (NEURONTIN) 300 MG capsule Take 900 mg by mouth 3 (three) times daily.   Yes Historical Provider, MD  hydrOXYzine (ATARAX/VISTARIL) 25 MG tablet Take 25 mg by mouth every 4 (four) hours as needed (anxiety).   Yes Historical Provider, MD  hyoscyamine (ANASPAZ) 0.125 MG TBDP disintergrating tablet Place 0.125 mg under the tongue 3 (three) times daily before meals.   Yes Historical Provider, MD  ibuprofen (ADVIL,MOTRIN) 200 MG tablet Take 200 mg by mouth every 6 (six) hours as needed for moderate pain.  Yes Historical Provider, MD  lithium 300 MG tablet Take 1,200 mg by mouth at bedtime. Take 4 tablets at bedtime   Yes Historical Provider, MD  loratadine (CLARITIN) 10 MG tablet Take 10 mg by mouth daily.   Yes Historical Provider, MD  OLANZapine (ZYPREXA) 10 MG tablet Take 10 mg by mouth at bedtime.   Yes Historical Provider, MD  oxyCODONE (OXY IR/ROXICODONE) 5 MG immediate release tablet Take 5 mg by mouth every 6 (six) hours as needed for severe pain (pain).   Yes Historical Provider, MD  potassium chloride (MICRO-K) 10 MEQ CR capsule Take  20 mEq by mouth 2 (two) times daily.   Yes Historical Provider, MD  triamcinolone (KENALOG) 0.025 % cream Apply 1 application topically 2 (two) times daily as needed (for rash). Uses on spot on bottom   Yes Historical Provider, MD   There were no vitals taken for this visit. Physical Exam  Nursing note and vitals reviewed. Constitutional: She is oriented to person, place, and time. She appears well-developed and well-nourished. No distress.  HENT:  Head: Normocephalic and atraumatic.  Eyes: Conjunctivae are normal.  Neck: Normal range of motion.  Cardiovascular: Normal rate and regular rhythm.  Exam reveals no gallop and no friction rub.   No murmur heard. Pulmonary/Chest: Effort normal and breath sounds normal. She has no wheezes. She has no rales. She exhibits no tenderness.  Abdominal: Soft. She exhibits no distension. There is no tenderness. There is no rebound.  Musculoskeletal: Normal range of motion.  No midline spine tenderness to palpation. Bilateral paraspinal thoracic tenderness to palpation.   Neurological: She is alert and oriented to person, place, and time. Coordination normal.  Lower extremity sensation equal and intact bilaterally. Speech is goal-oriented.   Skin: Skin is warm and dry.  Psychiatric: She has a normal mood and affect. Her behavior is normal.    ED Course  Procedures (including critical care time) Labs Review Labs Reviewed  CBC WITH DIFFERENTIAL - Abnormal; Notable for the following:    WBC 13.8 (*)    MCH 25.6 (*)    RDW 16.4 (*)    Neutro Abs 9.5 (*)    All other components within normal limits  BASIC METABOLIC PANEL - Abnormal; Notable for the following:    Glucose, Bld 101 (*)    Creatinine, Ser 0.35 (*)    All other components within normal limits  URINALYSIS, ROUTINE W REFLEX MICROSCOPIC - Abnormal; Notable for the following:    APPearance CLOUDY (*)    Leukocytes, UA LARGE (*)    All other components within normal limits  URINE  MICROSCOPIC-ADD ON - Abnormal; Notable for the following:    Bacteria, UA MANY (*)    All other components within normal limits  URINE CULTURE    Imaging Review No results found.   EKG Interpretation None      MDM   Final diagnoses:  Pyelonephritis, acute    12:57 PM Labs and urinalysis pending. Patient is tachycardic with remaining vitals stable. Patient will have dilaudid and zofran for symptoms.   3:30 PM Labs show elevated WBC at 13.8. Patient has a pyelonephritis based on urinalysis and symptoms. Patient will have 1g Rocephin here. Patient will be discharged with keflex. Patient has a pain contract for oxycodone so I will not prescribe pain medication here.    Emilia Beck, [place] 08/17/14 [phone]

## 2014-08-18 NOTE — ED Provider Notes (Signed)
Medical screening examination/treatment/procedure(s) were conducted as a shared visit with non-physician practitioner(s) and myself.  I personally evaluated the patient during the encounter.  Pt c/o low back pain, hx same. Present x years. No recent fall/injury. No new numbness/weakness. No fevers. Spine nt. +lumbarw muscular tenderness.    Suzi Roots, MD 08/18/14 [phone]

## 2014-08-19 LAB — URINE CULTURE: Colony Count: >=100000

## 2014-08-20 ENCOUNTER — Telehealth (HOSPITAL_BASED_OUTPATIENT_CLINIC_OR_DEPARTMENT_OTHER): Payer: Self-pay | Admitting: Emergency Medicine

## 2014-08-20 NOTE — Progress Notes (Signed)
ED Antimicrobial Stewardship Positive Culture Follow Up   Brianna Castillo is an 28 y.o. female who presented to [facility] on 08/17/2014 with a chief complaint of  Chief Complaint  Patient presents with  . Back Pain    Recent Results (from the past 720 hour(s))  URINE CULTURE     Status: None   Collection Time    08/17/14  2:08 PM      Result Value Ref Range Status   Specimen Description URINE, CATHETERIZED   Final   Special Requests NONE   Final   Culture  Setup Time     Final   Value: 08/17/2014 16:15     Performed at [facility]   Colony Count     Final   Value: >=100,000 COLONIES/ML     Performed at [facility]   Culture     Final   Value: ESCHERICHIA COLI     Performed at [facility]   Report Status 08/19/2014 FINAL   Final   Organism ID, Bacteria ESCHERICHIA COLI   Final     Treated with Keflex, organism resistant to prescribed antimicrobial  Patient discharged originally without antimicrobial agent and treatment is now indicated  New antibiotic prescription:  Stop Keflex Begin Bactrim DS PO BID x 10 days  ED Provider: Sharilyn Sites, PA-C   Khristy Kalan M 08/20/2014, 9:15 AM Infectious Diseases Pharmacist Phone# [phone]

## 2014-08-20 NOTE — Telephone Encounter (Signed)
Post ED Visit - Positive Culture Follow-up: Successful Patient Follow-Up  Culture assessed and recommendations reviewed by:  Wes Dulaney, Pharm.D., BCPS  Celedonio Miyamoto, Pharm.D., BCPS  Georgina Pillion, Pharm.D., BCPS  [place], [address].D., BCPS, AAHIVP  Estella Husk, Pharm.D., BCPS, AAHIVP  Red Christians, Pharm.D.  Cassie Roseanne Reno, Pharm.D.  Positive urine culture >100,000 E. Coli   Patient discharged without antimicrobial prescription and treatment is now indicated  Organism is resistant to prescribed ED discharge antimicrobial  Patient with positive blood cultures  Changes discussed with ED provider:  Sharilyn Sites PA   New antibiotic prescription Bactrim DS bid X 10 days  08/20/14  1037   Called to  Unable to reach pt, no answer   Berle Mull 08/20/2014, 10:34 AM

## 2014-08-21 ENCOUNTER — Telehealth (HOSPITAL_COMMUNITY): Payer: Self-pay

## 2014-09-10 ENCOUNTER — Inpatient Hospital Stay (HOSPITAL_COMMUNITY)
Admission: EM | Admit: 2014-09-10 | Discharge: 2014-09-17 | DRG: 872 | Disposition: A | Discharge status: Other Acute Inpatient Hospital | Payer: Medicaid Other | Attending: Internal Medicine | Admitting: Internal Medicine

## 2014-09-10 ENCOUNTER — Encounter (HOSPITAL_COMMUNITY): Payer: Self-pay | Admitting: Emergency Medicine

## 2014-09-10 ENCOUNTER — Inpatient Hospital Stay (HOSPITAL_COMMUNITY): Payer: Medicaid Other

## 2014-09-10 DIAGNOSIS — D72829 Elevated white blood cell count, unspecified: Secondary | ICD-10-CM | POA: Diagnosis not present

## 2014-09-10 DIAGNOSIS — K565 Intestinal adhesions [bands] with obstruction (postprocedural) (postinfection): Secondary | ICD-10-CM | POA: Diagnosis present

## 2014-09-10 DIAGNOSIS — R109 Unspecified abdominal pain: Secondary | ICD-10-CM

## 2014-09-10 DIAGNOSIS — R1084 Generalized abdominal pain: Secondary | ICD-10-CM

## 2014-09-10 DIAGNOSIS — F329 Major depressive disorder, single episode, unspecified: Secondary | ICD-10-CM

## 2014-09-10 DIAGNOSIS — J45909 Unspecified asthma, uncomplicated: Secondary | ICD-10-CM | POA: Diagnosis present

## 2014-09-10 DIAGNOSIS — Z9049 Acquired absence of other specified parts of digestive tract: Secondary | ICD-10-CM | POA: Diagnosis present

## 2014-09-10 DIAGNOSIS — Z79891 Long term (current) use of opiate analgesic: Secondary | ICD-10-CM | POA: Diagnosis not present

## 2014-09-10 DIAGNOSIS — Z885 Allergy status to narcotic agent status: Secondary | ICD-10-CM | POA: Diagnosis not present

## 2014-09-10 DIAGNOSIS — F319 Bipolar disorder, unspecified: Secondary | ICD-10-CM

## 2014-09-10 DIAGNOSIS — K56609 Unspecified intestinal obstruction, unspecified as to partial versus complete obstruction: Secondary | ICD-10-CM

## 2014-09-10 DIAGNOSIS — E860 Dehydration: Secondary | ICD-10-CM | POA: Diagnosis not present

## 2014-09-10 DIAGNOSIS — A4151 Sepsis due to Escherichia coli [E. coli]: Principal | ICD-10-CM | POA: Diagnosis present

## 2014-09-10 DIAGNOSIS — B9620 Unspecified Escherichia coli [E. coli] as the cause of diseases classified elsewhere: Secondary | ICD-10-CM | POA: Diagnosis present

## 2014-09-10 DIAGNOSIS — F419 Anxiety disorder, unspecified: Secondary | ICD-10-CM | POA: Diagnosis not present

## 2014-09-10 DIAGNOSIS — A419 Sepsis, unspecified organism: Secondary | ICD-10-CM

## 2014-09-10 DIAGNOSIS — N390 Urinary tract infection, site not specified: Secondary | ICD-10-CM | POA: Diagnosis not present

## 2014-09-10 DIAGNOSIS — G809 Cerebral palsy, unspecified: Secondary | ICD-10-CM | POA: Diagnosis not present

## 2014-09-10 DIAGNOSIS — Z888 Allergy status to other drugs, medicaments and biological substances status: Secondary | ICD-10-CM

## 2014-09-10 DIAGNOSIS — G801 Spastic diplegic cerebral palsy: Secondary | ICD-10-CM

## 2014-09-10 DIAGNOSIS — Z87440 Personal history of urinary (tract) infections: Secondary | ICD-10-CM | POA: Diagnosis not present

## 2014-09-10 DIAGNOSIS — R45851 Suicidal ideations: Secondary | ICD-10-CM

## 2014-09-10 DIAGNOSIS — N10 Acute tubulo-interstitial nephritis: Secondary | ICD-10-CM

## 2014-09-10 DIAGNOSIS — F32A Depression, unspecified: Secondary | ICD-10-CM

## 2014-09-10 DIAGNOSIS — K560 Paralytic ileus: Secondary | ICD-10-CM | POA: Diagnosis not present

## 2014-09-10 DIAGNOSIS — Z79899 Other long term (current) drug therapy: Secondary | ICD-10-CM

## 2014-09-10 LAB — CBC
HCT: 38.4 % (ref 36.0–46.0)
HEMOGLOBIN: 12.2 g/dL (ref 12.0–15.0)
MCH: 25.5 pg — ABNORMAL LOW (ref 26.0–34.0)
MCHC: 31.8 g/dL (ref 30.0–36.0)
MCV: 80.3 fL (ref 78.0–100.0)
Platelets: 352 10*3/uL (ref 150–400)
RBC: 4.78 MIL/uL (ref 3.87–5.11)
RDW: 16.1 % — AB (ref 11.5–15.5)
WBC: 14.1 10*3/uL — ABNORMAL HIGH (ref 4.0–10.5)

## 2014-09-10 LAB — URINALYSIS, ROUTINE W REFLEX MICROSCOPIC
BILIRUBIN URINE: NEGATIVE
Glucose, UA: NEGATIVE mg/dL
KETONES UR: NEGATIVE mg/dL
NITRITE: NEGATIVE
PH: 6.5 (ref 5.0–8.0)
Protein, ur: NEGATIVE mg/dL
Specific Gravity, Urine: 1.012 (ref 1.005–1.030)
UROBILINOGEN UA: 0.2 mg/dL (ref 0.0–1.0)

## 2014-09-10 LAB — COMPREHENSIVE METABOLIC PANEL
ALBUMIN: 3.8 g/dL (ref 3.5–5.2)
ALK PHOS: 123 U/L — AB (ref 39–117)
ALT: 15 U/L (ref 0–35)
AST: 13 U/L (ref 0–37)
Anion gap: 12 (ref 5–15)
BUN: 11 mg/dL (ref 6–23)
CHLORIDE: 106 meq/L (ref 96–112)
CO2: 21 mEq/L (ref 19–32)
CREATININE: 0.47 mg/dL — AB (ref 0.50–1.10)
Calcium: 9.7 mg/dL (ref 8.4–10.5)
GFR calc non Af Amer: >90 mL/min (ref >90)
GLUCOSE: 116 mg/dL — AB (ref 70–99)
POTASSIUM: 4.5 meq/L (ref 3.7–5.3)
Sodium: 139 mEq/L (ref 137–147)
TOTAL PROTEIN: 7.7 g/dL (ref 6.0–8.3)

## 2014-09-10 LAB — URINE MICROSCOPIC-ADD ON

## 2014-09-10 LAB — I-STAT CG4 LACTIC ACID, ED: Lactic Acid, Venous: 1.49 mmol/L (ref 0.5–2.2)

## 2014-09-10 MED ORDER — PIPERACILLIN-TAZOBACTAM 3.375 G IVPB
3.3750 g | Freq: Three times a day (TID) | INTRAVENOUS | Status: DC
  Administered 2014-09-10 – 2014-09-11 (×2): 3.375 g via INTRAVENOUS
  Filled 2014-09-10 (×3): qty 50

## 2014-09-10 MED ORDER — CYCLOBENZAPRINE HCL 5 MG PO TABS
5.0000 mg | ORAL_TABLET | Freq: Three times a day (TID) | ORAL | Status: DC | PRN
  Administered 2014-09-10 – 2014-09-13 (×5): 5 mg via ORAL
  Filled 2014-09-10 (×5): qty 1

## 2014-09-10 MED ORDER — HYDROMORPHONE HCL 1 MG/ML IJ SOLN
1.0000 mg | Freq: Once | INTRAMUSCULAR | Status: AC
  Administered 2014-09-10: 1 mg via INTRAVENOUS
  Filled 2014-09-10: qty 1

## 2014-09-10 MED ORDER — OXYCODONE HCL 5 MG PO TABS
5.0000 mg | ORAL_TABLET | Freq: Four times a day (QID) | ORAL | Status: DC | PRN
  Administered 2014-09-10 – 2014-09-14 (×11): 5 mg via ORAL
  Filled 2014-09-10 (×11): qty 1

## 2014-09-10 MED ORDER — ESCITALOPRAM OXALATE 10 MG PO TABS
10.0000 mg | ORAL_TABLET | Freq: Every day | ORAL | Status: DC
  Administered 2014-09-10 – 2014-09-14 (×5): 10 mg via ORAL
  Filled 2014-09-10 (×8): qty 1

## 2014-09-10 MED ORDER — LITHIUM CARBONATE 300 MG PO CAPS: 1200.0000 mg | ORAL_CAPSULE | Freq: Every day | ORAL | Status: DC

## 2014-09-10 MED ORDER — ONDANSETRON HCL 4 MG/2ML IJ SOLN
4.0000 mg | Freq: Once | INTRAMUSCULAR | Status: AC
  Administered 2014-09-10: 4 mg via INTRAVENOUS
  Filled 2014-09-10: qty 2

## 2014-09-10 MED ORDER — ACETAMINOPHEN 325 MG PO TABS
650.0000 mg | ORAL_TABLET | Freq: Four times a day (QID) | ORAL | Status: DC | PRN
  Administered 2014-09-11 (×2): 650 mg via ORAL
  Filled 2014-09-10 (×2): qty 2

## 2014-09-10 MED ORDER — SODIUM CHLORIDE 0.9 % IJ SOLN
3.0000 mL | Freq: Two times a day (BID) | INTRAMUSCULAR | Status: DC
  Administered 2014-09-10 – 2014-09-14 (×4): 3 mL via INTRAVENOUS

## 2014-09-10 MED ORDER — OLANZAPINE 10 MG PO TABS
10.0000 mg | ORAL_TABLET | Freq: Every day | ORAL | Status: DC
  Administered 2014-09-10 – 2014-09-13 (×4): 10 mg via ORAL
  Filled 2014-09-10 (×7): qty 1

## 2014-09-10 MED ORDER — HYDROXYZINE HCL 25 MG PO TABS
25.0000 mg | ORAL_TABLET | ORAL | Status: DC | PRN
  Administered 2014-09-10 – 2014-09-13 (×4): 25 mg via ORAL
  Filled 2014-09-10 (×4): qty 1

## 2014-09-10 MED ORDER — HYDROMORPHONE HCL 1 MG/ML IJ SOLN
1.0000 mg | Freq: Once | INTRAMUSCULAR | Status: AC
Start: 2014-09-10 — End: 2014-09-10
  Administered 2014-09-10: 1 mg via INTRAVENOUS
  Filled 2014-09-10: qty 1

## 2014-09-10 MED ORDER — LITHIUM CARBONATE 300 MG PO TABS: 1200.0000 mg | ORAL_TABLET | Freq: Every day | ORAL | Status: DC

## 2014-09-10 MED ORDER — HEPARIN SODIUM (PORCINE) 5000 UNIT/ML IJ SOLN
5000.0000 [IU] | Freq: Three times a day (TID) | INTRAMUSCULAR | Status: DC
  Administered 2014-09-10 – 2014-09-17 (×22): 5000 [IU] via SUBCUTANEOUS
  Filled 2014-09-10 (×24): qty 1

## 2014-09-10 MED ORDER — ONDANSETRON HCL 4 MG/2ML IJ SOLN
4.0000 mg | Freq: Four times a day (QID) | INTRAMUSCULAR | Status: DC | PRN
  Administered 2014-09-11 – 2014-09-14 (×2): 4 mg via INTRAVENOUS
  Filled 2014-09-10 (×2): qty 2

## 2014-09-10 MED ORDER — BUDESONIDE-FORMOTEROL FUMARATE 160-4.5 MCG/ACT IN AERO
2.0000 | INHALATION_SPRAY | Freq: Two times a day (BID) | RESPIRATORY_TRACT | Status: DC
  Administered 2014-09-10 – 2014-09-17 (×13): 2 via RESPIRATORY_TRACT
  Filled 2014-09-10: qty 6

## 2014-09-10 MED ORDER — LITHIUM CARBONATE ER 450 MG PO TBCR
1200.0000 mg | EXTENDED_RELEASE_TABLET | Freq: Every day | ORAL | Status: DC
  Administered 2014-09-10 – 2014-09-13 (×4): 1200 mg via ORAL
  Filled 2014-09-10 (×7): qty 1

## 2014-09-10 MED ORDER — GABAPENTIN 300 MG PO CAPS
900.0000 mg | ORAL_CAPSULE | Freq: Three times a day (TID) | ORAL | Status: DC
  Administered 2014-09-10 – 2014-09-14 (×12): 900 mg via ORAL
  Filled 2014-09-10 (×22): qty 3

## 2014-09-10 MED ORDER — SODIUM CHLORIDE 0.9 % IV SOLN
INTRAVENOUS | Status: DC
  Administered 2014-09-10: 16:00:00 via INTRAVENOUS
  Administered 2014-09-11: 1000 mL via INTRAVENOUS
  Administered 2014-09-12 – 2014-09-13 (×3): via INTRAVENOUS

## 2014-09-10 MED ORDER — ACETAMINOPHEN 650 MG RE SUPP
650.0000 mg | Freq: Four times a day (QID) | RECTAL | Status: DC | PRN
  Administered 2014-09-16: 650 mg via RECTAL
  Filled 2014-09-10: qty 1

## 2014-09-10 MED ORDER — ALBUTEROL SULFATE HFA 108 (90 BASE) MCG/ACT IN AERS: 1.0000 | INHALATION_SPRAY | Freq: Four times a day (QID) | RESPIRATORY_TRACT | Status: DC | PRN

## 2014-09-10 MED ORDER — SODIUM CHLORIDE 0.9 % IV BOLUS (SEPSIS)
2000.0000 mL | Freq: Once | INTRAVENOUS | Status: AC
  Administered 2014-09-10: 2000 mL via INTRAVENOUS

## 2014-09-10 MED ORDER — ONDANSETRON HCL 4 MG PO TABS
4.0000 mg | ORAL_TABLET | Freq: Four times a day (QID) | ORAL | Status: DC | PRN
  Administered 2014-09-10 – 2014-09-13 (×3): 4 mg via ORAL
  Filled 2014-09-10 (×3): qty 1

## 2014-09-10 MED ORDER — DEXTROSE 5 % IV SOLN
1.0000 g | Freq: Once | INTRAVENOUS | Status: AC
  Administered 2014-09-10: 1 g via INTRAVENOUS
  Filled 2014-09-10: qty 10

## 2014-09-10 MED ORDER — PIPERACILLIN-TAZOBACTAM 3.375 G IVPB 30 MIN
3.3750 g | INTRAVENOUS | Status: AC
  Administered 2014-09-10: 3.375 g via INTRAVENOUS

## 2014-09-10 NOTE — ED Provider Notes (Signed)
CSN: [id number]     Arrival date & time 09/10/14  [id number] History   First MD Initiated Contact with Patient 09/10/14 0957     Chief Complaint  Patient presents with  . Urinary Tract Infection     (Consider location/radiation/quality/duration/timing/severity/associated sxs/prior Treatment) HPI Comments: This is a 28 y/o female with a PMHx of cerebral palsy, bipolar disorder, asthma and pancreatitis who presents to the ED with concerns of a UTI. Pt was treated on 9/20 for a UTI and started on a 10 day course of keflex. Her PCP is concerned the UTI is still present as pt is experiencing constant worsening lower abdominal pain, back pain and flank pain. No aggravating or alleviating factors. She is usually catheterized through stoma in abdomen. States her abdomen is slightly more distended than normal. She endorses subjective low grade fevers over the past week. Also admits to non-bloody diarrhea and nausea. No vomiting. States she feels dehydrated.  Patient is a 28 y.o. female presenting with urinary tract infection. The history is provided by the patient.  Urinary Tract Infection Associated symptoms include abdominal pain, a fever and nausea.    Past Medical History  Diagnosis Date  . Asthma   . Bipolar 1 disorder   . Cerebral palsy with spastic/ataxic diplegia     spastic  . Pancreatitis   . Constipation    Past Surgical History  Procedure Laterality Date  . Appendectomy    . Cholecystectomy    . Major lower body reconstruction    . Eye surgery    . Baclofen pump refill      RLQ  . Urinary diversion     History reviewed. No pertinent family history. History  Substance Use Topics  . Smoking status: Never Smoker   . Smokeless tobacco: Never Used  . Alcohol Use: 0.6 oz/week    1 Shots of liquor per week     Comment: social drinker 1x  a moth    OB History   Grav Para Term Preterm Abortions TAB SAB Ect Mult Living   0 0 0 0 0 0 0 0 0 0      Review of Systems    Constitutional: Positive for fever.  Gastrointestinal: Positive for nausea, abdominal pain and diarrhea.  Genitourinary: Positive for flank pain.  Musculoskeletal: Positive for back pain.  All other systems reviewed and are negative.     Allergies  Methadone hcl; Morphine and related; Trazodone and nefazodone; and Versed  Home Medications   Prior to Admission medications   Medication Sig Start Date End Date Taking? Authorizing Provider  acetaminophen (TYLENOL) 500 MG tablet Take 1,000 mg by mouth every 6 (six) hours as needed for mild pain.   Yes Historical Provider, MD  albuterol (PROVENTIL HFA;VENTOLIN HFA) 108 (90 BASE) MCG/ACT inhaler Inhale 1-2 puffs into the lungs every 6 (six) hours as needed for wheezing. 01/27/13  Yes Linwood DibblesJon Knapp, MD  budesonide-formoterol [facility](SYMBICORT) 160-4.5 MCG/ACT inhaler Inhale 2 puffs into the lungs 2 (two) times daily.   Yes Historical Provider, MD  cyclobenzaprine (FLEXERIL) 5 MG tablet Take 5 mg by mouth 3 (three) times daily as needed for muscle spasms (pain).   Yes Historical Provider, MD  escitalopram (LEXAPRO) 10 MG tablet Take 10 mg by mouth daily.   Yes Historical Provider, MD  gabapentin (NEURONTIN) 300 MG capsule Take 900 mg by mouth 3 (three) times daily.   Yes Historical Provider, MD  hydrOXYzine (ATARAX/VISTARIL) 25 MG tablet Take 25 mg by mouth every 4 (four)  hours as needed (anxiety).   Yes Historical Provider, MD  hyoscyamine (ANASPAZ) 0.125 MG TBDP disintergrating tablet Place 0.125 mg under the tongue 3 (three) times daily before meals.   Yes Historical Provider, MD  Influenza Vac Split Quad (FLUZONE) 0.25 ML injection Inject 0.25 mLs into the muscle once.   Yes Historical Provider, MD  lithium 300 MG tablet Take 1,200 mg by mouth at bedtime. Take 4 tablets at bedtime   Yes Historical Provider, MD  loratadine (CLARITIN) 10 MG tablet Take 10 mg by mouth daily.   Yes Historical Provider, MD  OLANZapine (ZYPREXA) 10 MG tablet Take 10 mg by mouth  at bedtime.   Yes Historical Provider, MD  oxyCODONE (OXY IR/ROXICODONE) 5 MG immediate release tablet Take 5 mg by mouth every 6 (six) hours as needed for severe pain (pain).   Yes Historical Provider, MD  potassium chloride (MICRO-K) 10 MEQ CR capsule Take 20 mEq by mouth every morning.    Yes Historical Provider, MD  triamcinolone (KENALOG) 0.025 % cream Apply 1 application topically 2 (two) times daily as needed (for rash). Uses on spot on bottom   Yes Historical Provider, MD   BP 123/88  Pulse 117  Temp(Src) 98.8 F (37.1 C) (Rectal)  Resp 13  SpO2 94% Physical Exam  Nursing note and vitals reviewed. Constitutional: She is oriented to person, place, and time. She appears well-developed and well-nourished. No distress.  HENT:  Head: Normocephalic and atraumatic.  Dry MM.  Eyes: Conjunctivae are normal.  Neck: Normal range of motion. Neck supple.  Cardiovascular: Regular rhythm and normal heart sounds.   Tachycardic.  Pulmonary/Chest: Effort normal and breath sounds normal.  Abdominal: Soft. Bowel sounds are normal.    Mild abdominal distension. Generalized tenderness, worse suprapubic. No peritoneal signs. Bilateral CVA tenderness.  Musculoskeletal: Normal range of motion. She exhibits no edema.  Neurological: She is alert and oriented to person, place, and time.  Skin: Skin is warm and dry. She is not diaphoretic.  Psychiatric: She has a normal mood and affect. Her behavior is normal.    ED Course  Procedures (including critical care time) Labs Review Labs Reviewed  CBC - Abnormal; Notable for the following:    WBC 14.1 (*)    MCH 25.5 (*)    RDW 16.1 (*)    All other components within normal limits  COMPREHENSIVE METABOLIC PANEL - Abnormal; Notable for the following:    Glucose, Bld 116 (*)    Creatinine, Ser 0.47 (*)    Alkaline Phosphatase 123 (*)    Total Bilirubin <0.2 (*)    All other components within normal limits  URINALYSIS, ROUTINE W REFLEX MICROSCOPIC -  Abnormal; Notable for the following:    APPearance CLOUDY (*)    Hgb urine dipstick MODERATE (*)    Leukocytes, UA LARGE (*)    All other components within normal limits  URINE MICROSCOPIC-ADD ON - Abnormal; Notable for the following:    Bacteria, UA MANY (*)    All other components within normal limits  CULTURE, BLOOD (ROUTINE X 2)  CULTURE, BLOOD (ROUTINE X 2)  URINE CULTURE  I-STAT CG4 LACTIC ACID, ED    Imaging Review No results found.   EKG Interpretation None      MDM   Final diagnoses:  Sepsis due to urinary tract infection   Patient alert and oriented, in no apparent distress, temperature 99.1 rectally, tachycardic, vitals otherwise stable. History of multiple complicated UTIs including urosepsis. Concern for the same  today. Patient meets sirs criteria with leukocytosis of 14.1 and tachycardia, and sepsis due to source of infection from urine. Urine culture previously sensitive to cephalosporins. Will start IV Rocephin and admit. Admission accepted by Dr. Rhona Leavens, [facility], tele bed, inpatient.  Case discussed with attending Dr. Elesa Massed who agrees with plan of care.   Kathrynn Speed, PA-C 09/10/14 1310

## 2014-09-10 NOTE — ED Notes (Signed)
Freida Busmaniffany Patrick: Caregiver [phone]

## 2014-09-10 NOTE — ED Notes (Signed)
Bed: WA22 Expected date:  Expected time:  Means of arrival:  Comments: 

## 2014-09-10 NOTE — Progress Notes (Signed)
ANTIBIOTIC CONSULT NOTE - INITIAL  Pharmacy Consult for:  Zosyn Indication:  Sepsis  Allergies  Allergen Reactions  . Methadone Hcl Other (See Comments)    hypoxia  . Morphine And Related Other (See Comments)    hypoxia  . Trazodone And Nefazodone Other (See Comments)    Hard to awaken when takes this medication prefers not to take.  . Versed [Midazolam] Other (See Comments)    hypoxia    Patient Measurements: 10/14 - Height 64 inches   Weight 83 kg  Vital Signs: Temp: 98.4 F (36.9 C) (10/14 1337) Temp Source: Oral (10/14 1337) BP: 112/77 mmHg (10/14 1330) Pulse Rate: 117 (10/14 1333)  Labs:  Recent Labs  09/10/14 1102  WBC 14.1*  HGB 12.2  PLT 352  CREATININE 0.47*   The estimated CrCl is 108.1 ml/min using SCr 0.47 and the Cockroft-Gault equation.   Microbiology: Recent Results (from the past 720 hour(s))  URINE CULTURE     Status: None   Collection Time    08/17/14  2:08 PM      Result Value Ref Range Status   Specimen Description URINE, CATHETERIZED   Final   Special Requests NONE   Final   Culture  Setup Time     Final   Value: 08/17/2014 16:15     Performed at [facility]   Colony Count     Final   Value: >=100,000 COLONIES/ML     Performed at [facility]   Culture     Final   Value: ESCHERICHIA COLI     Performed at [facility]   Report Status 08/19/2014 FINAL   Final   Organism ID, Bacteria ESCHERICHIA COLI   Final    Medical History: Past Medical History  Diagnosis Date  . Asthma   . Bipolar 1 disorder   . Cerebral palsy with spastic/ataxic diplegia     spastic  . Pancreatitis   . Constipation     Medications:  Scheduled:  . budesonide-formoterol  2 puff Inhalation BID  . escitalopram  10 mg Oral Daily  . gabapentin  900 mg Oral TID  . heparin  5,000 Units Subcutaneous 3 times per day  . OLANZapine  10 mg Oral QHS  . sodium chloride  3 mL Intravenous Q12H   Assessment: Asked to assist with Zosyn  therapy for this 28 year-old female with cerebral palsy, recent outpatient treatment of E. coli  UTI, and sepsis.  Goal of Therapy:  Eradication of infection  Plan:  Zosyn 3.375 grams IV x 1 dose over 30 minutes in the ED, then 3.375 grams IV every 8 hours, each dose infused over 4 hours.  Polo Rileylaire Ramal Eckhardt R.Ph. 09/10/2014,2:07 PM

## 2014-09-10 NOTE — ED Notes (Signed)
Pt w/ hx of CP from home.  Home health doc states that pt has been on abx at the beginning of the month for UTI but doc states she does not believe it has gone away.  Pt is cathed through stoma in abd.  Low grade fever x 3 days.  A & O x 4.

## 2014-09-10 NOTE — ED Notes (Signed)
IN and out cath via [employer].

## 2014-09-10 NOTE — ED Notes (Addendum)
Patient transported to XR. 

## 2014-09-10 NOTE — ED Provider Notes (Signed)
Medical screening examination/treatment/procedure(s) were performed by non-physician practitioner and as supervising physician I was immediately available for consultation/collaboration.   EKG Interpretation None        Broderick Fonseca N Richey Doolittle, DO 09/10/14 1421 

## 2014-09-10 NOTE — H&P (Signed)
[facility] History and Physical  Brianna HawShannon C Elias ZOX:[id number] DOB: 05/02/1986 DOA: 09/10/2014  Referring physician: Emergency Department, Celene Skeenobyn Hess, PA PCP: Florentina JennyRIPP, HENRY, MD  Specialists:   Chief Complaint: UTI  HPI: Brianna Castillo is a 28 y.o. female  With a hx of cerebral pasly who was treated over the past month for pansensitive ecoli uti (positive urine cx on 08/17/14). Pt was reportedly treated with a course of keflex as well as rocephin prior to admit.Sx persisted and the patient presented to the ED where she was noted to have a leukocytosis of 14k with tachycardia. The patient was restarted on rocephin and the hospitalist service consulted for consideration for admission.  On further questioning, pt reports diarrhea and generalized abd pain for the past 3-4 days, slightly improved today. Reports normally stooling once every 4-5 days prior to admit, but now stooling nearly hourly.  Review of Systems:  Per above, the remainder of the 10pt ros reviewed and are neg  Past Medical History  Diagnosis Date  . Asthma   . Bipolar 1 disorder   . Cerebral palsy with spastic/ataxic diplegia     spastic  . Pancreatitis   . Constipation    Past Surgical History  Procedure Laterality Date  . Appendectomy    . Cholecystectomy    . Major lower body reconstruction    . Eye surgery    . Baclofen pump refill      RLQ  . Urinary diversion     Social History:  reports that she has never smoked. She has never used smokeless tobacco. She reports that she drinks about .6 ounces of alcohol per week. She reports that she does not use illicit drugs.  where does patient live--home, ALF, SNF? and with whom if at home?  Can patient participate in ADLs?  Allergies  Allergen Reactions  . Methadone Hcl Other (See Comments)    hypoxia  . Morphine And Related Other (See Comments)    hypoxia  . Trazodone And Nefazodone Other (See Comments)    Hard to awaken when takes this medication  prefers not to take.  . Versed [Midazolam] Other (See Comments)    hypoxia    History reviewed. No pertinent family history.  (be sure to complete)  Prior to Admission medications   Medication Sig Start Date End Date Taking? Authorizing Provider  acetaminophen (TYLENOL) 500 MG tablet Take 1,000 mg by mouth every 6 (six) hours as needed for mild pain.   Yes Historical Provider, MD  albuterol (PROVENTIL HFA;VENTOLIN HFA) 108 (90 BASE) MCG/ACT inhaler Inhale 1-2 puffs into the lungs every 6 (six) hours as needed for wheezing. 01/27/13  Yes Linwood DibblesJon Knapp, MD  budesonide-formoterol [facility](SYMBICORT) 160-4.5 MCG/ACT inhaler Inhale 2 puffs into the lungs 2 (two) times daily.   Yes Historical Provider, MD  cyclobenzaprine (FLEXERIL) 5 MG tablet Take 5 mg by mouth 3 (three) times daily as needed for muscle spasms (pain).   Yes Historical Provider, MD  escitalopram (LEXAPRO) 10 MG tablet Take 10 mg by mouth daily.   Yes Historical Provider, MD  gabapentin (NEURONTIN) 300 MG capsule Take 900 mg by mouth 3 (three) times daily.   Yes Historical Provider, MD  hydrOXYzine (ATARAX/VISTARIL) 25 MG tablet Take 25 mg by mouth every 4 (four) hours as needed (anxiety).   Yes Historical Provider, MD  hyoscyamine (ANASPAZ) 0.125 MG TBDP disintergrating tablet Place 0.125 mg under the tongue 3 (three) times daily before meals.   Yes Historical Provider, MD  Influenza  Vac Split Quad (FLUZONE) 0.25 ML injection Inject 0.25 mLs into the muscle once.   Yes Historical Provider, MD  lithium 300 MG tablet Take 1,200 mg by mouth at bedtime. Take 4 tablets at bedtime   Yes Historical Provider, MD  loratadine (CLARITIN) 10 MG tablet Take 10 mg by mouth daily.   Yes Historical Provider, MD  OLANZapine (ZYPREXA) 10 MG tablet Take 10 mg by mouth at bedtime.   Yes Historical Provider, MD  oxyCODONE (OXY IR/ROXICODONE) 5 MG immediate release tablet Take 5 mg by mouth every 6 (six) hours as needed for severe pain (pain).   Yes Historical  Provider, MD  potassium chloride (MICRO-K) 10 MEQ CR capsule Take 20 mEq by mouth every morning.    Yes Historical Provider, MD  triamcinolone (KENALOG) 0.025 % cream Apply 1 application topically 2 (two) times daily as needed (for rash). Uses on spot on bottom   Yes Historical Provider, MD   Physical Exam: Filed Vitals:   09/10/14 1200 09/10/14 1230 09/10/14 1259 09/10/14 1300  BP: 117/72 123/88 123/88 107/64  Pulse: 113  117 120  Temp:   98.8 F (37.1 C)   TempSrc:   Rectal   Resp: 10 15 13 12   SpO2: 92%  94% 95%     General:  Awake, in nad  Eyes: PERRL B  ENT: membranes dry, dentition fair  Neck: trachea midline, neck supple  Cardiovascular: tachycardic, s1, s2  Respiratory: normal resp effort, no wheezing  Abdomen: soft, mildly distended, pos bs  Skin: normal skin turgor, no abnormal skin lesions seen  Musculoskeletal: perfused, no clubbing  Psychiatric: mood/affect normal// no auditory/visual hallucinations  Neurologic: cn2-12 grossly intact, strength/senastion intact  Labs on Admission:  Basic Metabolic Panel:  Recent Labs Lab 09/10/14 1102  NA 139  K 4.5  CL 106  CO2 21  GLUCOSE 116*  BUN 11  CREATININE 0.47*  CALCIUM 9.7   Liver Function Tests:  Recent Labs Lab 09/10/14 1102  AST 13  ALT 15  ALKPHOS 123*  BILITOT <0.2*  PROT 7.7  ALBUMIN 3.8   No results found for this basename: LIPASE, AMYLASE,  in the last 168 hours No results found for this basename: AMMONIA,  in the last 168 hours CBC:  Recent Labs Lab 09/10/14 1102  WBC 14.1*  HGB 12.2  HCT 38.4  MCV 80.3  PLT 352   Cardiac Enzymes: No results found for this basename: CKTOTAL, CKMB, CKMBINDEX, TROPONINI,  in the last 168 hours  BNP (last 3 results) No results found for this basename: PROBNP,  in the last 8760 hours CBG: No results found for this basename: GLUCAP,  in the last 168 hours  Radiological Exams on Admission: No results  found.   Assessment/Plan Principal Problem:   Sepsis due to urinary tract infection Active Problems:   Cerebral palsy with spastic/ataxic diplegia   Bipolar 1 disorder   Cerebral palsy   1. Sepsis with UTI 1. Will pan culture patient 2. Pending results of cx, will cont on empiric zosyn 3. Cont with fluid resuscitation as tolerated 4. Admit to med-tele 2. Cerebral Palsy 1. Appears stable at present 2. Monitor for now 3. Bipolar 1 1. Appears stable 4. DVT prophylaxis 1. Heparin subq 5. Diarrhea/abd pain 1. Will check stool culture and cdiff toxin, especially as pt has been on abx over the past month 2. Will check abd xray as pt's abd seems mildly distended to r/o obstruction  Code Status: Full (must indicate code status--if  unknown or must be presumed, indicate so) Family Communication: Pt in room (indicate person spoken with, if applicable, with phone number if by telephone) Disposition Plan: Pending (indicate anticipated LOS)  Time spent:  CHIU, STEPHEN K [facility] Pager [phone]  If 7PM-7AM, please contact night-coverage [email or web address] Password [email or web address] 09/10/2014, 1:16 PM

## 2014-09-11 DIAGNOSIS — R45851 Suicidal ideations: Secondary | ICD-10-CM

## 2014-09-11 DIAGNOSIS — F329 Major depressive disorder, single episode, unspecified: Secondary | ICD-10-CM

## 2014-09-11 LAB — COMPREHENSIVE METABOLIC PANEL
ALBUMIN: 3.2 g/dL — AB (ref 3.5–5.2)
ALK PHOS: 113 U/L (ref 39–117)
ALT: 36 U/L — ABNORMAL HIGH (ref 0–35)
AST: 27 U/L (ref 0–37)
Anion gap: 15 (ref 5–15)
BUN: 8 mg/dL (ref 6–23)
CHLORIDE: 107 meq/L (ref 96–112)
CO2: 21 mEq/L (ref 19–32)
Calcium: 9.1 mg/dL (ref 8.4–10.5)
Creatinine, Ser: 0.48 mg/dL — ABNORMAL LOW (ref 0.50–1.10)
GFR calc Af Amer: >90 mL/min (ref >90)
GFR calc non Af Amer: >90 mL/min (ref >90)
Glucose, Bld: 127 mg/dL — ABNORMAL HIGH (ref 70–99)
POTASSIUM: 4.3 meq/L (ref 3.7–5.3)
Sodium: 143 mEq/L (ref 137–147)
TOTAL PROTEIN: 6.4 g/dL (ref 6.0–8.3)
Total Bilirubin: 0.2 mg/dL — ABNORMAL LOW (ref 0.3–1.2)

## 2014-09-11 LAB — CBC
HEMATOCRIT: 34.3 % — AB (ref 36.0–46.0)
Hemoglobin: 10.5 g/dL — ABNORMAL LOW (ref 12.0–15.0)
MCH: 25.1 pg — ABNORMAL LOW (ref 26.0–34.0)
MCHC: 30.6 g/dL (ref 30.0–36.0)
MCV: 81.9 fL (ref 78.0–100.0)
Platelets: 305 10*3/uL (ref 150–400)
RBC: 4.19 MIL/uL (ref 3.87–5.11)
RDW: 16.2 % — ABNORMAL HIGH (ref 11.5–15.5)
WBC: 15.3 10*3/uL — AB (ref 4.0–10.5)

## 2014-09-11 LAB — INFLUENZA PANEL BY PCR (TYPE A & B)
H1N1FLUPCR: NOT DETECTED
INFLBPCR: NEGATIVE
Influenza A By PCR: NEGATIVE

## 2014-09-11 MED ORDER — DEXTROSE 5 % IV SOLN
1.0000 g | INTRAVENOUS | Status: AC
  Administered 2014-09-11: 1 g via INTRAVENOUS
  Filled 2014-09-11: qty 10

## 2014-09-11 MED ORDER — BISACODYL 10 MG RE SUPP
10.0000 mg | Freq: Once | RECTAL | Status: AC
  Administered 2014-09-11: 10 mg via RECTAL
  Filled 2014-09-11: qty 1

## 2014-09-11 MED ORDER — LABETALOL HCL 5 MG/ML IV SOLN
5.0000 mg | INTRAVENOUS | Status: DC | PRN
  Administered 2014-09-11 – 2014-09-14 (×4): 5 mg via INTRAVENOUS
  Filled 2014-09-11 (×3): qty 4

## 2014-09-11 MED ORDER — DEXTROSE 5 % IV SOLN
1.0000 g | INTRAVENOUS | Status: DC
  Administered 2014-09-12: 1 g via INTRAVENOUS
  Filled 2014-09-11: qty 10

## 2014-09-11 MED ORDER — MAGNESIUM CITRATE PO SOLN
1.0000 | Freq: Once | ORAL | Status: AC
  Administered 2014-09-11: 1 via ORAL

## 2014-09-11 NOTE — Progress Notes (Signed)
[facility] PROGRESS NOTE  Brianna Castillo NFA:[id number] DOB: 4/9Constance Castillo/1987 DOA: 09/10/2014 PCP: Florentina JennyRIPP, HENRY, MD  Assessment/Plan: 1. Sepsis with UTI  1. Blood cx thus far neg x 2 2. Urine cx with >100,000 gm neg rods 3. Was initially on empiric zosyn, however previous urine cx reviewed. Pt has hx of ecoli uti resistant to zosyn and cipro, and sensitive to rocephin and bactrim 4. Cont with fluid resuscitation as tolerated 2. Cerebral Palsy  1. Appears stable at present 2. Monitor for now 3. Bipolar 1  1. Appears stable 4. DVT prophylaxis  1. Heparin subq 5. Diarrhea/abd pain  1. Stool culture and cdiff toxin, especially as pt has been on abx over the past month 2. Will check abd xray as pt's abd seems mildly distended to r/o obstruction  Code Status: Full Family Communication: Pt in room Disposition Plan: Pending   Consultants:    Procedures:    Antibiotics:  Zosyn 10/14>>>10/15  Rocephin 10/15>>>   HPI/Subjective: Pt still feels ill. No acute events noted overnight  Objective: Filed Vitals:   09/10/14 2031 09/10/14 2213 09/11/14 0504 09/11/14 0732  BP:  123/74 107/89   Pulse:  111 108   Temp:  98.2 F (36.8 C) 97.6 F (36.4 C)   TempSrc:  Oral Oral   Resp:  16 20   Height:      Weight:      SpO2: 96% 98% 99% 98%    Intake/Output Summary (Last 24 hours) at 09/11/14 1306 Last data filed at 09/11/14 1130  Gross per 24 hour  Intake   2567 ml  Output   4800 ml  Net  -2233 ml   Filed Weights   09/10/14 1458  Weight: 83.008 kg (183 lb)    Exam:   General:  Awake, in nad  Cardiovascular: tachycardic s1, s2  Respiratory: normal resp effort, no wheezing  Abdomen: soft, nondistended  Musculoskeletal: perfused, no clubbing   Data Reviewed: Basic Metabolic Panel:  Recent Labs Lab 09/10/14 1102 09/11/14 0400  NA 139 143  K 4.5 4.3  CL 106 107  CO2 21 21  GLUCOSE 116* 127*  BUN 11 8  CREATININE 0.47* 0.48*  CALCIUM 9.7 9.1    Liver Function Tests:  Recent Labs Lab 09/10/14 1102 09/11/14 0400  AST 13 27  ALT 15 36*  ALKPHOS 123* 113  BILITOT <0.2* 0.2*  PROT 7.7 6.4  ALBUMIN 3.8 3.2*   No results found for this basename: LIPASE, AMYLASE,  in the last 168 hours No results found for this basename: AMMONIA,  in the last 168 hours CBC:  Recent Labs Lab 09/10/14 1102 09/11/14 0400  WBC 14.1* 15.3*  HGB 12.2 10.5*  HCT 38.4 34.3*  MCV 80.3 81.9  PLT 352 305   Cardiac Enzymes: No results found for this basename: CKTOTAL, CKMB, CKMBINDEX, TROPONINI,  in the last 168 hours BNP (last 3 results) No results found for this basename: PROBNP,  in the last 8760 hours CBG: No results found for this basename: GLUCAP,  in the last 168 hours  Recent Results (from the past 240 hour(s))  CULTURE, BLOOD (ROUTINE X 2)     Status: None   Collection Time    09/10/14 10:57 AM      Result Value Ref Range Status   Specimen Description BLOOD LEFT ANTECUBITAL   Final   Special Requests BOTTLES DRAWN AEROBIC AND ANAEROBIC [facility]5CC EACH   Final   Culture  Setup Time     Final  Value: 09/10/2014 14:44     Performed at [facility]   Culture     Final   Value:        BLOOD CULTURE RECEIVED NO GROWTH TO DATE CULTURE WILL BE HELD FOR 5 DAYS BEFORE ISSUING A FINAL NEGATIVE REPORT     Performed at [facility]   Report Status PENDING   Incomplete  CULTURE, BLOOD (ROUTINE X 2)     Status: None   Collection Time    09/10/14 11:02 AM      Result Value Ref Range Status   Specimen Description BLOOD RIGHT HAND   Final   Special Requests BOTTLES DRAWN AEROBIC AND ANAEROBIC   Final   Culture  Setup Time     Final   Value: 09/10/2014 14:44     Performed at [facility]   Culture     Final   Value:        BLOOD CULTURE RECEIVED NO GROWTH TO DATE CULTURE WILL BE HELD FOR 5 DAYS BEFORE ISSUING A FINAL NEGATIVE REPORT     Performed at [facility]   Report Status PENDING   Incomplete  URINE  CULTURE     Status: None   Collection Time    09/10/14 11:29 AM      Result Value Ref Range Status   Specimen Description URINE, CATHETERIZED   Final   Special Requests NONE   Final   Culture  Setup Time     Final   Value: 09/10/2014 15:00     Performed at [facility] Count     Final   Value: >=100,000 COLONIES/ML     Performed at [facility]   Culture     Final   Value: GRAM NEGATIVE RODS     Performed at [facility]   Report Status PENDING   Incomplete     Studies: Dg Abd Portable 1v  09/10/2014   CLINICAL DATA:  Upper abdominal pain.  Nausea for 3 days.  EXAM: PORTABLE ABDOMEN - 1 VIEW  COMPARISON:  CT 06/30/2014.  Abdomen series 8 12/2013  FINDINGS: Cyst with a dynamic ileus. Lucencies noted over the colon most likely related to stool however pneumatosis coli cannot be excluded. Follow-up abdominal series suggested. CT of the abdomen pelvis can be obtained. No acute bony abnormality.  IMPRESSION: 1. Dilated loops of small and large bowel consistent with adynamic ileus. 2. Lucencies noted over the colon most likely relates to stool. Pneumatosis coli cannot be excluded. Continued follow-up abdominal series suggested to demonstrate clearing of these findings. If need be CT of the abdomen pelvis can be obtained to further evaluate.   Electronically Signed   By: Maisie Fus  Register   On: 09/10/2014 14:40    Scheduled Meds: . budesonide-formoterol  2 puff Inhalation BID  . escitalopram  10 mg Oral Daily  . gabapentin  900 mg Oral TID  . heparin  5,000 Units Subcutaneous 3 times per day  . lithium carbonate  1,200 mg Oral QHS  . OLANZapine  10 mg Oral QHS  . sodium chloride  3 mL Intravenous Q12H   Continuous Infusions: . sodium chloride 1,000 mL (09/11/14 1108)    Principal Problem:   Sepsis due to urinary tract infection Active Problems:   Cerebral palsy with spastic/ataxic diplegia   Bipolar 1 disorder   Cerebral palsy   Sepsis secondary to  UTI  Time spent:  Aayana Reinertsen K  [facility] Pager [phone]. If 7PM-7AM, please contact night-coverage at [email or web address], password [email or web address] 09/11/2014, 1:06 PM  LOS: 1 day

## 2014-09-11 NOTE — Progress Notes (Signed)
ANTIBIOTIC CONSULT NOTE - FOLLOW UP  Pharmacy Consult for Ceftriaxone Indication: UTI  Allergies  Allergen Reactions  . Methadone Hcl Other (See Comments)    hypoxia  . Morphine And Related Other (See Comments)    hypoxia  . Trazodone And Nefazodone Other (See Comments)    Hard to awaken when takes this medication prefers not to take.  . Versed [Midazolam] Other (See Comments)    hypoxia    Patient Measurements: Height: 5\' 4"  (162.6 cm) Weight: 183 lb (83.008 kg) IBW/kg (Calculated) : 54.7  Vital Signs: Temp: 97.6 F (36.4 C) (10/15 0504) Temp Source: Oral (10/15 0504) BP: 107/89 mmHg (10/15 0504) Pulse Rate: 108 (10/15 0504) Intake/Output from previous day: 10/14 0701 - 10/15 0700 In: 1643 [P.O.:840; I.V.:753; IV Piggyback:50] Out: 4480 [Urine:4480] Intake/Output from this shift: Total I/O In: 924 [P.O.:924] Out: 1600 [Urine:1600]  Labs:  Recent Labs  09/10/14 1102 09/11/14 0400  WBC 14.1* 15.3*  HGB 12.2 10.5*  PLT 352 305  CREATININE 0.47* 0.48*   Estimated Creatinine Clearance: 108.1 ml/min (by C-G formula based on Cr of 0.48). No results found for this basename: VANCOTROUGH, Leodis BinetVANCOPEAK, VANCORANDOM, GENTTROUGH, GENTPEAK, GENTRANDOM, TOBRATROUGH, TOBRAPEAK, TOBRARND, AMIKACINPEAK, AMIKACINTROU, AMIKACIN,  in the last 72 hours   Microbiology: Recent Results (from the past 720 hour(s))  URINE CULTURE     Status: None   Collection Time    08/17/14  2:08 PM      Result Value Ref Range Status   Specimen Description URINE, CATHETERIZED   Final   Special Requests NONE   Final   Culture  Setup Time     Final   Value: 08/17/2014 16:15     [facility]   Colony Count     Final   Value: >=100,000 COLONIES/ML     [facility]   Culture     Final   Value: ESCHERICHIA COLI     [facility]   Report Status 08/19/2014 FINAL   Final   Organism ID, Bacteria ESCHERICHIA COLI   Final  CULTURE, BLOOD  (ROUTINE X 2)     Status: None   Collection Time    09/10/14 10:57 AM      Result Value Ref Range Status   Specimen Description BLOOD LEFT ANTECUBITAL   Final   Special Requests BOTTLES DRAWN AEROBIC AND ANAEROBIC 5CC EACH   Final   Culture  Setup Time     Final   Value: 09/10/2014 14:44     [facility]   Culture     Final   Value:        BLOOD CULTURE RECEIVED NO GROWTH TO DATE CULTURE WILL BE HELD FOR 5 DAYS BEFORE ISSUING A FINAL NEGATIVE REPORT     [facility]   Report Status PENDING   Incomplete  CULTURE, BLOOD (ROUTINE X 2)     Status: None   Collection Time    09/10/14 11:02 AM      Result Value Ref Range Status   Specimen Description BLOOD RIGHT HAND   Final   Special Requests BOTTLES DRAWN AEROBIC AND ANAEROBIC 5ML   Final   Culture  Setup Time     Final   Value: 09/10/2014 14:44     [facility]   Culture     Final   Value:        BLOOD CULTURE RECEIVED NO GROWTH TO  DATE CULTURE WILL BE HELD FOR 5 DAYS BEFORE ISSUING A FINAL NEGATIVE REPORT     [facility]   Report Status PENDING   Incomplete  URINE CULTURE     Status: None   Collection Time    09/10/14 11:29 AM      Result Value Ref Range Status   Specimen Description URINE, CATHETERIZED   Final   Special Requests NONE   Final   Culture  Setup Time     Final   Value: 09/10/2014 15:00     [facility]   Colony Count     Final   Value: >=100,000 COLONIES/ML     [facility]   Culture     Final   Value: GRAM NEGATIVE RODS     [facility]   Report Status PENDING   Incomplete     Assessment: 89 yoF admitted 10/14 with sepsis from UTI.  She has h/o cerebral palsy and requires chronic catheterization through stoma in abdomen.  She was treated recently with Keflex for Ecoli UTI (9/20).  She was given first dose of ceftriaxone in ED, then pharmacy consulted to dose  Zosyn.  Changed back to ceftriaxone 10/15 d/t history of resistance to Zosyn.  10/14 >> Ceftriaxone x 1 in ED 10/14 >> Zosyn >> 10/15 10/15 >> Ceftriaxone >>  Today, 10/15: Tmax: 99.1 WBCs: Increased to 15.3 Renal: SCr 0.48 CrCl > 100 ml/min Blood and urine cultures pending.   Goal of Therapy:  Appropriate abx dosing, eradication of infection.   Plan:   Ceftriaxone 1g IV q24h  No renal dosing required, pharmacy to sign off note writing.  Follow up urine cultures peripherally.  Lynann Beaverhristine Zuhayr Deeney PharmD, BCPS Pager [phone] 09/11/2014 1:31 PM

## 2014-09-11 NOTE — Plan of Care (Signed)
Problem: Phase I Progression Outcomes Goal: Voiding-avoid urinary catheter unless indicated Outcome: Not Applicable Date Met:  05/28/15 I&O cath stoma of [place] Pouch q 4hrs & irrigate via cath q 8hrs w/60cc ns

## 2014-09-11 NOTE — Care Management Note (Addendum)
    Page 1 of 2   09/17/2014     1:24:40 PM CARE MANAGEMENT NOTE 09/17/2014  Patient:  Constance HawBUCKNER,Danaye C   Account Number:  [id number]  Date Initiated:  09/11/2014  Documentation initiated by:  Lanier ClamMAHABIR,Maeryn Mcgath  Subjective/Objective Assessment:   28 Y/O F ADMITTED W/UTI.ZO:XWRUEAV,WUJWJXBHX:BIPOLAR,URINARY DIVERSION.     Action/Plan:   FROM HOME W/CAREGIVERS-CAPPS-OUTWARD BOUNDS-24HR CG SERVICE.HAS RW,W/C,3N1.PCP-DR. TRIP(HOME VISITS)HX:CEREBRAL PALSY.   Anticipated DC Date:  09/17/2014   Anticipated DC Plan:  ACUTE TO ACUTE TRANS      DC Planning Services  CM consult      PAC Choice  Resumption Of Svcs/PTA Provider   Choice offered to / List presented to:  C-1 Patient           [facility]   Status of service:  Completed, signed off Medicare Important Message given?   (If response is "NO", the following Medicare IM given date fields will be blank) Date Medicare IM given:   Medicare IM given by:   Date Additional Medicare IM given:   Additional Medicare IM given by:    Discharge Disposition:  ACUTE TO ACUTE TRANS  Per UR Regulation:  Reviewed for med. necessity/level of care/duration of stay  If discussed at Long Length of Stay Meetings, dates discussed:   09/16/2014    Comments:  09/17/14 Daielle Melcher RN,BSN NCM [phone] ACUTE TO ACUTE TRANSFER TO [facility] MANAGING TRANSP-COBRA FORM,FULL TRANSFER REPORT,CARELINK.TC [facility]  LIASON MADE HER AWARE OF TRANSFER.  09/16/14 Damonie Furney RN,BSN NCM [phone] SX FOLLOWING-[facility]-CONSERVATIVE TREATMENT,NGT,BOWEL REST.NOTED MAY TRANSFER TO TERTIARY CARE SERVICE.  09/15/14 Myrtha Tonkovich RN,BSN NCM [phone] PT-HH.HAS MEDICAID ONLY HHRN-SAFETY EVAL APPROVED.[facility] CHOSEN FOR HHC.TC MARY REP AWARE OF REFERRAL.AWAIT FINAL HHRN ORDER.[facility]-NGT,IVF.AMBULANCE TRANSP NEEDED @  D/C.  09/11/14 Dariane Natzke RN,BSN NCM [phone] D/C PLAN RETURN HOME W/RESUMPTION OF CAREGIVER SERVICE.

## 2014-09-12 LAB — CBC
HCT: 35.8 % — ABNORMAL LOW (ref 36.0–46.0)
Hemoglobin: 10.8 g/dL — ABNORMAL LOW (ref 12.0–15.0)
MCH: 24.9 pg — ABNORMAL LOW (ref 26.0–34.0)
MCHC: 30.2 g/dL (ref 30.0–36.0)
MCV: 82.5 fL (ref 78.0–100.0)
Platelets: 329 10*3/uL (ref 150–400)
RBC: 4.34 MIL/uL (ref 3.87–5.11)
RDW: 16.2 % — ABNORMAL HIGH (ref 11.5–15.5)
WBC: 14.2 10*3/uL — ABNORMAL HIGH (ref 4.0–10.5)

## 2014-09-12 LAB — URINE CULTURE

## 2014-09-12 LAB — BASIC METABOLIC PANEL
Anion gap: 9 (ref 5–15)
BUN: 7 mg/dL (ref 6–23)
CO2: 25 mEq/L (ref 19–32)
Calcium: 9.2 mg/dL (ref 8.4–10.5)
Chloride: 108 mEq/L (ref 96–112)
Creatinine, Ser: 0.45 mg/dL — ABNORMAL LOW (ref 0.50–1.10)
GFR calc Af Amer: >90 mL/min (ref >90)
GFR calc non Af Amer: >90 mL/min (ref >90)
Glucose, Bld: 116 mg/dL — ABNORMAL HIGH (ref 70–99)
Potassium: 3.9 mEq/L (ref 3.7–5.3)
Sodium: 142 mEq/L (ref 137–147)

## 2014-09-12 LAB — CLOSTRIDIUM DIFFICILE BY PCR: Toxigenic C. Difficile by PCR: NEGATIVE

## 2014-09-12 NOTE — Progress Notes (Signed)
[facility] PROGRESS NOTE  Constance HawShannon C Kiger ZOX:[id number] DOB: 06/20/1986 DOA: 09/10/2014 PCP: Florentina JennyRIPP, HENRY, MD  Assessment/Plan: 1. Sepsis with UTI  1. Blood cx thus far neg x 2 2. Urine cx with >100,000 pan-sensitive ecoli 3. Currently continued on rocephin 4. Cont with fluid resuscitation as tolerated 5. Leukocytosis improving 2. Cerebral Palsy  1. Appears stable at present 2. Monitor for now 3. Bipolar 1  1. Appears stable 4. DVT prophylaxis  1. Heparin subq 5. Constipation with ileus 1. Cdiff neg 2. Xray abd with distended loops of bowel and stool 3. Resolved after cathartics 4. Will resume diet as toleratred  Code Status: Full Family Communication: Pt in room Disposition Plan: Pending   Consultants:    Procedures:    Antibiotics:  Zosyn 10/14>>>10/15  Rocephin 10/15>>>   HPI/Subjective: No acute events noted overnight  Objective: Filed Vitals:   09/12/14 0451 09/12/14 1030 09/12/14 1439 09/12/14 1603  BP: 111/66  106/86   Pulse: 112  115   Temp: 98.3 F (36.8 C)  98.6 F (37 C)   TempSrc: Oral  Oral   Resp: 16  18   Height:      Weight:      SpO2: 99% 96% 99% 95%    Intake/Output Summary (Last 24 hours) at 09/12/14 1708 Last data filed at 09/12/14 1602  Gross per 24 hour  Intake 3451.66 ml  Output   4500 ml  Net -1048.34 ml   Filed Weights   09/10/14 1458  Weight: 83.008 kg (183 lb)    Exam:   General:  Awake, in nad  Cardiovascular: tachycardic s1, s2  Respiratory: normal resp effort, no wheezing  Abdomen: soft, previous abd distension improved  Musculoskeletal: perfused, no clubbing   Data Reviewed: Basic Metabolic Panel:  Recent Labs Lab 09/10/14 1102 09/11/14 0400 09/12/14 0415  NA 139 143 142  K 4.5 4.3 3.9  CL 106 107 108  CO2 21 21 25   GLUCOSE 116* 127* 116*  BUN 11 8 7   CREATININE 0.47* 0.48* 0.45*  CALCIUM 9.7 9.1 9.2   Liver Function Tests:  Recent Labs Lab 09/10/14 1102 09/11/14 0400   AST 13 27  ALT 15 36*  ALKPHOS 123* 113  BILITOT <0.2* 0.2*  PROT 7.7 6.4  ALBUMIN 3.8 3.2*   No results found for this basename: LIPASE, AMYLASE,  in the last 168 hours No results found for this basename: AMMONIA,  in the last 168 hours CBC:  Recent Labs Lab 09/10/14 1102 09/11/14 0400 09/12/14 0415  WBC 14.1* 15.3* 14.2*  HGB 12.2 10.5* 10.8*  HCT 38.4 34.3* 35.8*  MCV 80.3 81.9 82.5  PLT 352 305 329   Cardiac Enzymes: No results found for this basename: CKTOTAL, CKMB, CKMBINDEX, TROPONINI,  in the last 168 hours BNP (last 3 results) No results found for this basename: PROBNP,  in the last 8760 hours CBG: No results found for this basename: GLUCAP,  in the last 168 hours  Recent Results (from the past 240 hour(s))  CULTURE, BLOOD (ROUTINE X 2)     Status: None   Collection Time    09/10/14 10:57 AM      Result Value Ref Range Status   Specimen Description BLOOD LEFT ANTECUBITAL   Final   Special Requests BOTTLES DRAWN AEROBIC AND ANAEROBIC [facility]5CC EACH   Final   Culture  Setup Time     Final   Value: 09/10/2014 14:44     Performed at [facility]  Culture     Final   Value:        BLOOD CULTURE RECEIVED NO GROWTH TO DATE CULTURE WILL BE HELD FOR 5 DAYS BEFORE ISSUING A FINAL NEGATIVE REPORT     Performed at [facility]   Report Status PENDING   Incomplete  CULTURE, BLOOD (ROUTINE X 2)     Status: None   Collection Time    09/10/14 11:02 AM      Result Value Ref Range Status   Specimen Description BLOOD RIGHT HAND   Final   Special Requests BOTTLES DRAWN AEROBIC AND ANAEROBIC 5ML   Final   Culture  Setup Time     Final   Value: 09/10/2014 14:44     Performed at [facility]   Culture     Final   Value:        BLOOD CULTURE RECEIVED NO GROWTH TO DATE CULTURE WILL BE HELD FOR 5 DAYS BEFORE ISSUING A FINAL NEGATIVE REPORT     Performed at [facility]   Report Status PENDING   Incomplete  URINE CULTURE     Status: None    Collection Time    09/10/14 11:29 AM      Result Value Ref Range Status   Specimen Description URINE, CATHETERIZED   Final   Special Requests NONE   Final   Culture  Setup Time     Final   Value: 09/10/2014 15:00     Performed at [facility]   Colony Count     Final   Value: >=100,000 COLONIES/ML     Performed at [facility]   Culture     Final   Value: ESCHERICHIA COLI     Performed at [facility]   Report Status 09/12/2014 FINAL   Final   Organism ID, Bacteria ESCHERICHIA COLI   Final  CLOSTRIDIUM DIFFICILE BY PCR     Status: None   Collection Time    09/11/14  8:40 PM      Result Value Ref Range Status   C difficile by pcr NEGATIVE  NEGATIVE Final   Comment: [facility]     Studies: No results found.  Scheduled Meds: . budesonide-formoterol  2 puff Inhalation BID  . cefTRIAXone (ROCEPHIN)  IV  1 g Intravenous Q24H  . escitalopram  10 mg Oral Daily  . gabapentin  900 mg Oral TID  . heparin  5,000 Units Subcutaneous 3 times per day  . lithium carbonate  1,200 mg Oral QHS  . OLANZapine  10 mg Oral QHS  . sodium chloride  3 mL Intravenous Q12H   Continuous Infusions: . sodium chloride 100 mL/hr at 09/12/14 1616    Principal Problem:   Sepsis due to urinary tract infection Active Problems:   Cerebral palsy with spastic/ataxic diplegia   Bipolar 1 disorder   Cerebral palsy   Sepsis secondary to UTI  Time spent: 35min  CHIU, STEPHEN K  [facility] Pager [phone]. If 7PM-7AM, please contact night-coverage at [email or web address], password [email or web address] 09/12/2014, 5:08 PM  LOS: 2 days

## 2014-09-13 LAB — CBC
HCT: 34 % — ABNORMAL LOW (ref 36.0–46.0)
HEMOGLOBIN: 10.4 g/dL — AB (ref 12.0–15.0)
MCH: 25.2 pg — AB (ref 26.0–34.0)
MCHC: 30.6 g/dL (ref 30.0–36.0)
MCV: 82.3 fL (ref 78.0–100.0)
Platelets: 306 10*3/uL (ref 150–400)
RBC: 4.13 MIL/uL (ref 3.87–5.11)
RDW: 16.1 % — ABNORMAL HIGH (ref 11.5–15.5)
WBC: 13.3 10*3/uL — ABNORMAL HIGH (ref 4.0–10.5)

## 2014-09-13 LAB — BASIC METABOLIC PANEL
Anion gap: 11 (ref 5–15)
BUN: 7 mg/dL (ref 6–23)
CALCIUM: 9.3 mg/dL (ref 8.4–10.5)
CO2: 25 meq/L (ref 19–32)
CREATININE: 0.45 mg/dL — AB (ref 0.50–1.10)
Chloride: 101 mEq/L (ref 96–112)
GFR calc Af Amer: >90 mL/min (ref >90)
GLUCOSE: 128 mg/dL — AB (ref 70–99)
Potassium: 3.7 mEq/L (ref 3.7–5.3)
SODIUM: 137 meq/L (ref 137–147)

## 2014-09-13 MED ORDER — BISACODYL 10 MG RE SUPP
10.0000 mg | Freq: Once | RECTAL | Status: AC
  Administered 2014-09-13: 10 mg via RECTAL
  Filled 2014-09-13: qty 1

## 2014-09-13 MED ORDER — CEFUROXIME AXETIL 250 MG PO TABS
250.0000 mg | ORAL_TABLET | Freq: Two times a day (BID) | ORAL | Status: DC
  Administered 2014-09-13 – 2014-09-14 (×3): 250 mg via ORAL
  Filled 2014-09-13 (×4): qty 1

## 2014-09-13 MED ORDER — MAGNESIUM CITRATE PO SOLN
1.0000 | Freq: Once | ORAL | Status: AC
  Administered 2014-09-13: 1 via ORAL

## 2014-09-13 MED ORDER — MAGIC MOUTHWASH W/LIDOCAINE
5.0000 mL | Freq: Four times a day (QID) | ORAL | Status: DC | PRN
  Filled 2014-09-13: qty 5

## 2014-09-13 NOTE — Evaluation (Signed)
Physical Therapy Evaluation Patient Details Name: Brianna Castillo MRN: [id number] DOB: 05/09/1986 Today's Date: 09/13/2014   History of Present Illness  Pt is a 28 year old female with hx of cerebral palsy with spastic/ataxic diplegia admitted with sepsis with UTI, abdominal pain and reports decreased mobility over last 3 weeks.  Clinical Impression  Pt currently with functional limitations due to the deficits listed below (see PT Problem List).  Pt will benefit from skilled PT to increase their independence and safety with mobility to allow discharge to the venue listed below. Pt reports becoming weaker over last 3 weeks and unable to assist with her transfers, at baseline able to perform modified stand pivot to her motorized w/c.  Pt also reports being in program that provides care at home to keep her out of SNF.  Pt would benefit from acute PT to assist with increasing strength and mobility to allow pt to become more independent and decrease burden of care.    Follow Up Recommendations Home health PT;Supervision/Assistance - 24 hour    Equipment Recommendations  None recommended by PT    Recommendations for Other Services       Precautions / Restrictions Precautions Precautions: Fall Required Braces or Orthoses: Other Brace/Splint Other Brace/Splint: reports bil AFOs at home      Mobility  Bed Mobility Overal bed mobility: Needs Assistance;+2 for physical assistance Bed Mobility: Sit to Sidelying;Supine to Sit     Supine to sit: Max assist;+2 for physical assistance   Sit to sidelying: +2 for physical assistance;Mod assist General bed mobility comments: verbal cues for technique, pt able to bring lower body to EOB however required increased assist for trunk upright and scooting to EOB, assist for LEs onto bed sidelying position due to stomach pain  Transfers                    Ambulation/Gait                Stairs            Wheelchair Mobility     Modified Rankin (Stroke Patients Only)       Balance Overall balance assessment: Needs assistance Sitting-balance support: Feet supported;Bilateral upper extremity supported Sitting balance-Leahy Scale: Poor Sitting balance - Comments: pt dependent on UE support otherwise requires assist for trunk, sat EOB approx 4 minutes and pt worked on one UE support however unable to hold more then 5 seconds before requiring bil UE support again                                     Pertinent Vitals/Pain Pain Assessment: 0-10 Pain Score: 7  Pain Location: abdominal pain with sitting Pain Descriptors / Indicators: Sharp;Shooting Pain Intervention(s): Limited activity within patient's tolerance;Monitored during session;Repositioned    Home Living Family/patient expects to be discharged to:: Private residence Living Arrangements: Other (Comment) (caregivers) Available Help at Discharge: Personal care attendant;Available 24 hours/day         Home Layout: One level Home Equipment: Wheelchair - power;Other (comment) (hoyer lift)      Prior Function Level of Independence: Needs assistance   Gait / Transfers Assistance Needed: reports modified independent with electric w/c modified foot mount for stand pivot transfer until about a few weeks ago after UTI dx, requiring more assist and then mostly bed bound           Hand Dominance  Extremity/Trunk Assessment   Upper Extremity Assessment: Generalized weakness           Lower Extremity Assessment: RLE deficits/detail;LLE deficits/detail RLE Deficits / Details: pt able to perform HS with AAROM, feet rest in increased DF, pt reports right side usually more difficult to move LLE Deficits / Details: pt able to perform HS with AAROM, feet rest in increased DF     Communication   Communication: No difficulties  Cognition Arousal/Alertness: Awake/alert Behavior During Therapy: WFL for tasks  assessed/performed Overall Cognitive Status: History of cognitive impairments - at baseline                      General Comments      Exercises        Assessment/Plan    PT Assessment Patient needs continued PT services  PT Diagnosis Generalized weakness   PT Problem List Decreased strength;Decreased balance;Decreased mobility  PT Treatment Interventions DME instruction;Balance training;Neuromuscular re-education;Patient/family education;Functional mobility training;Therapeutic activities;Therapeutic exercise   PT Goals (Current goals can be found in the Care Plan section) Acute Rehab PT Goals PT Goal Formulation: With patient Time For Goal Achievement: 09/20/14 Potential to Achieve Goals: Good    Frequency Min 3X/week   Barriers to discharge        Co-evaluation               End of Session   Activity Tolerance: Patient limited by pain Patient left: with call bell/phone within reach;in bed           Time: [id number] PT Time Calculation (min): 21 min   Charges:   PT Evaluation $Initial PT Evaluation Tier I: 1 Procedure PT Treatments $Therapeutic Activity: 8-22 mins   PT G Codes:          Liyana Suniga,KATHrine E 09/13/2014, 3:43 PM Zenovia JarredKati Daryan Buell, PT, DPT 09/13/2014 Pager: [phone]

## 2014-09-13 NOTE — Progress Notes (Signed)
[facility] PROGRESS NOTE  Constance HawShannon C Dragone ZOX:[id number] DOB: 06/05/1986 DOA: 09/10/2014 PCP: Florentina JennyRIPP, HENRY, MD  Assessment/Plan: 1. Sepsis with UTI  1. Blood cx thus far neg x 2 2. Urine cx with >100,000 pan-sensitive ecoli 3. Currently continued on rocephin 4. Leukocytosis improving 5. Will transition to ceftin today 2. Cerebral Palsy  1. Appears stable at present 2. Monitor for now 3. PT/OT consulted 3. Bipolar 1  1. Appears stable 4. DVT prophylaxis  1. Heparin subq 5. Constipation with ileus 1. Cdiff neg 2. Xray abd with distended loops of bowel and stool 3. Resolution is noted after cathartics 4. Cont cathartics as needed  Code Status: Full Family Communication: Pt in room Disposition Plan: Pending   Consultants:    Procedures:    Antibiotics:  Zosyn 10/14>>>10/15  Rocephin 10/15>>> 10/17  Ceftin 10/17>>>  HPI/Subjective: Abd noted to be more distended. Complains of increased spasticity.  Objective: Filed Vitals:   09/12/14 1603 09/12/14 2208 09/13/14 0500 09/13/14 1043  BP:   112/60   Pulse:   110   Temp: 98.8 F (37.1 C)  97.7 F (36.5 C)   TempSrc: Rectal  Oral   Resp:   18   Height:      Weight:      SpO2: 95% 98% 93% 95%    Intake/Output Summary (Last 24 hours) at 09/13/14 1337 Last data filed at 09/13/14 1128  Gross per 24 hour  Intake   3930 ml  Output   4075 ml  Net   -145 ml   Filed Weights   09/10/14 1458  Weight: 83.008 kg (183 lb)    Exam:   General:  Awake, in nad  Cardiovascular: tachycardic s1, s2  Respiratory: normal resp effort, no wheezing  Abdomen: soft, previous abd distension improved  Musculoskeletal: perfused, no clubbing   Data Reviewed: Basic Metabolic Panel:  Recent Labs Lab 09/10/14 1102 09/11/14 0400 09/12/14 0415 09/13/14 0755  NA 139 143 142 137  K 4.5 4.3 3.9 3.7  CL 106 107 108 101  CO2 21 21 25 25   GLUCOSE 116* 127* 116* 128*  BUN 11 8 7 7   CREATININE 0.47* 0.48*  0.45* 0.45*  CALCIUM 9.7 9.1 9.2 9.3   Liver Function Tests:  Recent Labs Lab 09/10/14 1102 09/11/14 0400  AST 13 27  ALT 15 36*  ALKPHOS 123* 113  BILITOT <0.2* 0.2*  PROT 7.7 6.4  ALBUMIN 3.8 3.2*   No results found for this basename: LIPASE, AMYLASE,  in the last 168 hours No results found for this basename: AMMONIA,  in the last 168 hours CBC:  Recent Labs Lab 09/10/14 1102 09/11/14 0400 09/12/14 0415 09/13/14 0755  WBC 14.1* 15.3* 14.2* 13.3*  HGB 12.2 10.5* 10.8* 10.4*  HCT 38.4 34.3* 35.8* 34.0*  MCV 80.3 81.9 82.5 82.3  PLT 352 305 329 306   Cardiac Enzymes: No results found for this basename: CKTOTAL, CKMB, CKMBINDEX, TROPONINI,  in the last 168 hours BNP (last 3 results) No results found for this basename: PROBNP,  in the last 8760 hours CBG: No results found for this basename: GLUCAP,  in the last 168 hours  Recent Results (from the past 240 hour(s))  CULTURE, BLOOD (ROUTINE X 2)     Status: None   Collection Time    09/10/14 10:57 AM      Result Value Ref Range Status   Specimen Description BLOOD LEFT ANTECUBITAL   Final   Special Requests BOTTLES DRAWN AEROBIC AND ANAEROBIC  [facility] EACH   Final   Culture  Setup Time     Final   Value: 09/10/2014 14:44     Performed at [facility]   Culture     Final   Value:        BLOOD CULTURE RECEIVED NO GROWTH TO DATE CULTURE WILL BE HELD FOR 5 DAYS BEFORE ISSUING A FINAL NEGATIVE REPORT     Performed at [facility]   Report Status PENDING   Incomplete  CULTURE, BLOOD (ROUTINE X 2)     Status: None   Collection Time    09/10/14 11:02 AM      Result Value Ref Range Status   Specimen Description BLOOD RIGHT HAND   Final   Special Requests BOTTLES DRAWN AEROBIC AND ANAEROBIC 5ML   Final   Culture  Setup Time     Final   Value: 09/10/2014 14:44     Performed at [facility]   Culture     Final   Value:        BLOOD CULTURE RECEIVED NO GROWTH TO DATE CULTURE WILL BE HELD FOR 5 DAYS  BEFORE ISSUING A FINAL NEGATIVE REPORT     Performed at [facility]   Report Status PENDING   Incomplete  URINE CULTURE     Status: None   Collection Time    09/10/14 11:29 AM      Result Value Ref Range Status   Specimen Description URINE, CATHETERIZED   Final   Special Requests NONE   Final   Culture  Setup Time     Final   Value: 09/10/2014 15:00     Performed at [facility]   Colony Count     Final   Value: >=100,000 COLONIES/ML     Performed at [facility]   Culture     Final   Value: ESCHERICHIA COLI     Performed at [facility]   Report Status 09/12/2014 FINAL   Final   Organism ID, Bacteria ESCHERICHIA COLI   Final  CLOSTRIDIUM DIFFICILE BY PCR     Status: None   Collection Time    09/11/14  8:40 PM      Result Value Ref Range Status   C difficile by pcr NEGATIVE  NEGATIVE Final   Comment: [facility]      Studies: No results found.  Scheduled Meds: . budesonide-formoterol  2 puff Inhalation BID  . cefUROXime  250 mg Oral BID WC  . escitalopram  10 mg Oral Daily  . gabapentin  900 mg Oral TID  . heparin  5,000 Units Subcutaneous 3 times per day  . lithium carbonate  1,200 mg Oral QHS  . OLANZapine  10 mg Oral QHS  . sodium chloride  3 mL Intravenous Q12H   Continuous Infusions:    Principal Problem:   Sepsis due to urinary tract infection Active Problems:   Cerebral palsy with spastic/ataxic diplegia   Bipolar 1 disorder   Cerebral palsy   Sepsis secondary to UTI  Time spent: 35min  Gabreil [place]onkers K  [facility] Pager [phone]. If 7PM-7AM, please contact night-coverage at [email or web address], password [email or web address] 09/13/2014, 1:37 PM  LOS: 3 days

## 2014-09-14 ENCOUNTER — Inpatient Hospital Stay (HOSPITAL_COMMUNITY): Payer: Medicaid Other

## 2014-09-14 DIAGNOSIS — K5669 Other intestinal obstruction: Secondary | ICD-10-CM

## 2014-09-14 DIAGNOSIS — R1084 Generalized abdominal pain: Secondary | ICD-10-CM

## 2014-09-14 MED ORDER — HYDROMORPHONE HCL 1 MG/ML IJ SOLN
0.5000 mg | Freq: Four times a day (QID) | INTRAMUSCULAR | Status: DC | PRN
  Administered 2014-09-14 – 2014-09-17 (×9): 0.5 mg via INTRAVENOUS
  Filled 2014-09-14 (×9): qty 1

## 2014-09-14 MED ORDER — PHENOL 1.4 % MT LIQD
1.0000 | OROMUCOSAL | Status: DC | PRN
  Administered 2014-09-15: 1 via OROMUCOSAL
  Filled 2014-09-14: qty 177

## 2014-09-14 MED ORDER — KETOROLAC TROMETHAMINE 15 MG/ML IJ SOLN: 15.0000 mg | Freq: Four times a day (QID) | INTRAMUSCULAR | Status: DC | PRN

## 2014-09-14 MED ORDER — MORPHINE SULFATE 2 MG/ML IJ SOLN: 2.0000 mg | INTRAMUSCULAR | Status: DC | PRN

## 2014-09-14 MED ORDER — PROCHLORPERAZINE EDISYLATE 5 MG/ML IJ SOLN
10.0000 mg | Freq: Four times a day (QID) | INTRAMUSCULAR | Status: DC | PRN
  Administered 2014-09-14 (×2): 10 mg via INTRAVENOUS
  Filled 2014-09-14 (×2): qty 2

## 2014-09-14 MED ORDER — DEXTROSE 5 % IV SOLN
1.0000 g | INTRAVENOUS | Status: DC
  Administered 2014-09-14 – 2014-09-16 (×3): 1 g via INTRAVENOUS
  Filled 2014-09-14 (×4): qty 10

## 2014-09-14 MED ORDER — METOPROLOL TARTRATE 1 MG/ML IV SOLN
5.0000 mg | Freq: Four times a day (QID) | INTRAVENOUS | Status: DC
  Administered 2014-09-14 – 2014-09-17 (×12): 5 mg via INTRAVENOUS
  Filled 2014-09-14 (×12): qty 5

## 2014-09-14 MED ORDER — SODIUM CHLORIDE 0.9 % IV SOLN
INTRAVENOUS | Status: DC
  Administered 2014-09-14 – 2014-09-17 (×6): via INTRAVENOUS

## 2014-09-14 NOTE — Progress Notes (Signed)
[facility] PROGRESS NOTE  Brianna Castillo YQM:[id number] DOB: October 25, 1986 DOA: 09/10/2014 PCP: [name], MD  Assessment/Plan: 1. Sepsis with UTI  1. Blood cx neg x 2 2. Urine cx with >100,000 pan-sensitive ecoli 3. Leukocytosis had been improving 4. Transitioned to ceftin 10/17 5. Consider resuming IV rocephin secondary to below 6. Afebrile 2. Cerebral Palsy  1. Appears stable at present 2. Monitor for now 3. PT/OT consulted with recs for home health PT 3. Bipolar 1  1. Appears stable 4. DVT prophylaxis  1. Heparin subq 5. SBO with ileus 1. Cdiff neg 2. Xray abd with distended loops of bowel 3. Previously resolved after cathartics 4. Decreased BS, not recently improved with mg citrate 5. Cont NPO 6. Will place NG to LWS  Code Status: Full Family Communication: Pt in room Disposition Plan: Pending   Consultants:    Procedures:    Antibiotics:  Zosyn 10/14>>>10/15  Rocephin 10/15>>> 10/17  Ceftin 10/17>>>  HPI/Subjective: Complains of feeling "bad." No BM yesterday despite mg citrate  Objective: Filed Vitals:   09/13/14 1043 09/13/14 2129 09/13/14 2207 09/14/14 0430  BP:   132/94 112/75  Pulse:   118 90  Temp:   99.9 F (37.7 C) 97.7 F (36.5 C)  TempSrc:   Rectal Axillary  Resp:   16 18  Height:      Weight:      SpO2: 95% 96% 97% 95%    Intake/Output Summary (Last 24 hours) at 09/14/14 0842 Last data filed at 09/14/14 0700  Gross per 24 hour  Intake 2906.67 ml  Output   4825 ml  Net -1918.33 ml   Filed Weights   09/10/14 1458  Weight: 83.008 kg (183 lb)    Exam:   General:  Awake, in nad  Cardiovascular: tachycardic s1, s2  Respiratory: normal resp effort, no wheezing  Abdomen: distended, decreased BS  Musculoskeletal: perfused, no clubbing   Data Reviewed: Basic Metabolic Panel:  Recent Labs Lab 09/10/14 1102 09/11/14 0400 09/12/14 0415 09/13/14 0755  NA 139 143 142 137  K 4.5 4.3 3.9 3.7  CL 106 107  108 101  CO2 21 21 25 25   GLUCOSE 116* 127* 116* 128*  BUN 11 8 7 7   CREATININE 0.47* 0.48* 0.45* 0.45*  CALCIUM 9.7 9.1 9.2 9.3   Liver Function Tests:  Recent Labs Lab 09/10/14 1102 09/11/14 0400  AST 13 27  ALT 15 36*  ALKPHOS 123* 113  BILITOT <0.2* 0.2*  PROT 7.7 6.4  ALBUMIN 3.8 3.2*   No results found for this basename: LIPASE, AMYLASE,  in the last 168 hours No results found for this basename: AMMONIA,  in the last 168 hours CBC:  Recent Labs Lab 09/10/14 1102 09/11/14 0400 09/12/14 0415 09/13/14 0755  WBC 14.1* 15.3* 14.2* 13.3*  HGB 12.2 10.5* 10.8* 10.4*  HCT 38.4 34.3* 35.8* 34.0*  MCV 80.3 81.9 82.5 82.3  PLT 352 305 329 306   Cardiac Enzymes: No results found for this basename: CKTOTAL, CKMB, CKMBINDEX, TROPONINI,  in the last 168 hours BNP (last 3 results) No results found for this basename: PROBNP,  in the last 8760 hours CBG: No results found for this basename: GLUCAP,  in the last 168 hours  Recent Results (from the past 240 hour(s))  CULTURE, BLOOD (ROUTINE X 2)     Status: None   Collection Time    09/10/14 10:57 AM      Result Value Ref Range Status   Specimen Description BLOOD  LEFT ANTECUBITAL   Final   Special Requests BOTTLES DRAWN AEROBIC AND ANAEROBIC [facility]5CC EACH   Final   Culture  Setup Time     Final   Value: 09/10/2014 14:44     Performed at [facility]   Culture     Final   Value:        BLOOD CULTURE RECEIVED NO GROWTH TO DATE CULTURE WILL BE HELD FOR 5 DAYS BEFORE ISSUING A FINAL NEGATIVE REPORT     Performed at [facility]   Report Status PENDING   Incomplete  CULTURE, BLOOD (ROUTINE X 2)     Status: None   Collection Time    09/10/14 11:02 AM      Result Value Ref Range Status   Specimen Description BLOOD RIGHT HAND   Final   Special Requests BOTTLES DRAWN AEROBIC AND ANAEROBIC 5ML   Final   Culture  Setup Time     Final   Value: 09/10/2014 14:44     Performed at [facility]   Culture     Final    Value:        BLOOD CULTURE RECEIVED NO GROWTH TO DATE CULTURE WILL BE HELD FOR 5 DAYS BEFORE ISSUING A FINAL NEGATIVE REPORT     Performed at [facility]   Report Status PENDING   Incomplete  URINE CULTURE     Status: None   Collection Time    09/10/14 11:29 AM      Result Value Ref Range Status   Specimen Description URINE, CATHETERIZED   Final   Special Requests NONE   Final   Culture  Setup Time     Final   Value: 09/10/2014 15:00     Performed at [facility]   Colony Count     Final   Value: >=100,000 COLONIES/ML     Performed at [facility]   Culture     Final   Value: ESCHERICHIA COLI     Performed at [facility]   Report Status 09/12/2014 FINAL   Final   Organism ID, Bacteria ESCHERICHIA COLI   Final  STOOL CULTURE     Status: None   Collection Time    09/11/14  8:40 PM      Result Value Ref Range Status   Specimen Description STOOL   Final   Special Requests NONE   Final   Culture     Final   Value: Culture reincubated for better growth     Performed at [facility]   Report Status PENDING   Incomplete  CLOSTRIDIUM DIFFICILE BY PCR     Status: None   Collection Time    09/11/14  8:40 PM      Result Value Ref Range Status   C difficile by pcr NEGATIVE  NEGATIVE Final   Comment: [facility]     Studies: No results found.  Scheduled Meds: . budesonide-formoterol  2 puff Inhalation BID  . cefUROXime  250 mg Oral BID WC  . escitalopram  10 mg Oral Daily  . gabapentin  900 mg Oral TID  . heparin  5,000 Units Subcutaneous 3 times per day  . lithium carbonate  1,200 mg Oral QHS  . OLANZapine  10 mg Oral QHS  . sodium chloride  3 mL Intravenous Q12H   Continuous Infusions:    Principal Problem:   Sepsis due to urinary tract infection Active Problems:  Cerebral palsy with spastic/ataxic diplegia   Bipolar 1 disorder   Cerebral palsy   Sepsis secondary to UTI  Time spent: 35min  CHIU,  STEPHEN K  [facility] Pager [phone]. If 7PM-7AM, please contact night-coverage at [email or web address], password [email or web address] 09/14/2014, 8:42 AM  LOS: 4 days

## 2014-09-14 NOTE — Progress Notes (Signed)
Patient requested  spray for sore throat. PCP on call was texted.

## 2014-09-14 NOTE — Evaluation (Signed)
Occupational Therapy Evaluation Patient Details Name: Brianna Castillo MRN: [id number] DOB: 01/18/1986 Today's Date: 09/14/2014    History of Present Illness Pt is a 28 year old female with hx of cerebral palsy with spastic/ataxic diplegia admitted with sepsis with UTI, abdominal pain and reports decreased mobility over last 3 weeks.   Clinical Impression   Pt is dependent in bathing, dressing, toileting and grooming at baseline and can assist with pivoting to her motorized w/c.  She can self feed when positioned optimally in her w/c.  Pt demonstrates generalized UE weakness, R>L and is interested in a home exercise program for strengthening.  Will follow acutely.    Follow Up Recommendations  No OT follow up;Supervision/Assistance - 24 hour    Equipment Recommendations  None recommended by OT    Recommendations for Other Services       Precautions / Restrictions Precautions Precautions: Fall Required Braces or Orthoses: Other Brace/Splint Other Brace/Splint: reports bil AFOs at home Restrictions Weight Bearing Restrictions: No      Mobility Bed Mobility Overal bed mobility: Needs Assistance;+2 for physical assistance Bed Mobility: Rolling Rolling: +2 for physical assistance;Mod assist         General bed mobility comments: Pt nauseous with rolling, unable to tolerate movement.  Transfers                 General transfer comment: not performed    Balance                                            ADL Overall ADL's : At baseline (dependent in all ADL except feeding)                                       General ADL Comments: Pt sponge bathes and uses a bedpan at home with assist of her aide.     Vision                     Perception     Praxis      Pertinent Vitals/Pain Pain Assessment: Faces Faces Pain Scale: Hurts even more Pain Location: abdomen Pain Descriptors / Indicators: Aching Pain  Intervention(s): Monitored during session;Premedicated before session;Repositioned;Limited activity within patient's tolerance     Hand Dominance Left   Extremity/Trunk Assessment     Lower Extremity Assessment Lower Extremity Assessment: Defer to PT evaluation       Communication Communication Communication: No difficulties   Cognition Arousal/Alertness: Awake/alert Behavior During Therapy: WFL for tasks assessed/performed Overall Cognitive Status: History of cognitive impairments - at baseline                     General Comments       Exercises Exercises: General Upper Extremity     Shoulder Instructions      Home Living Family/patient expects to be discharged to:: Private residence Living Arrangements: Other (Comment) (caregivers) Available Help at Discharge: Personal care attendant;Available 24 hours/day Type of Home: Apartment       Home Layout: One level     Bathroom Shower/Tub: Chief Strategy OfficerTub/shower unit   Bathroom Toilet: Standard     Home Equipment: Wheelchair - power;Other (comment) (hoyer lift, rolling shower seat)          Prior Functioning/Environment  Level of Independence: Needs assistance  Gait / Transfers Assistance Needed: reports modified independent with electric w/c modified foot mount for stand pivot transfer until about a few weeks ago after UTI dx, requiring more assist and then mostly bed bound ADL's / Homemaking Assistance Needed: dependent in sponge bathing, use of bedpan, all IADL, can self feed when positioned optimally in w/c        OT Diagnosis: Generalized weakness;Acute pain   OT Problem List: Decreased strength   OT Treatment/Interventions: Therapeutic exercise;Patient/family education    OT Goals(Current goals can be found in the care plan section) Acute Rehab OT Goals Patient Stated Goal: pt wants to avoid a rotator cuff tear OT Goal Formulation: With patient Time For Goal Achievement: 09/21/14 Potential to Achieve  Goals: Good  OT Frequency: Min 2X/week   Barriers to D/C:            Co-evaluation              End of Session    Activity Tolerance: Treatment limited secondary to medical complications (Comment) (pt with nausea with position change) Patient left: in bed;with call bell/phone within reach   Time: [id number] OT Time Calculation (min): 18 min Charges:  OT General Charges $OT Visit: 1 Procedure OT Evaluation $Initial OT Evaluation Tier I: 1 Procedure OT Treatments $Therapeutic Exercise: 8-22 mins G-Codes:    Evern BioMayberry, Brianna Castillo 09/14/2014, 11:03 AM [phone]

## 2014-09-15 ENCOUNTER — Encounter (HOSPITAL_COMMUNITY): Payer: Self-pay | Admitting: Radiology

## 2014-09-15 ENCOUNTER — Inpatient Hospital Stay (HOSPITAL_COMMUNITY): Payer: Medicaid Other

## 2014-09-15 LAB — STOOL CULTURE

## 2014-09-15 LAB — BASIC METABOLIC PANEL
Anion gap: 13 (ref 5–15)
BUN: 9 mg/dL (ref 6–23)
CALCIUM: 9.5 mg/dL (ref 8.4–10.5)
CO2: 22 mEq/L (ref 19–32)
Chloride: 102 mEq/L (ref 96–112)
Creatinine, Ser: 0.5 mg/dL (ref 0.50–1.10)
GFR calc Af Amer: >90 mL/min (ref >90)
GLUCOSE: 104 mg/dL — AB (ref 70–99)
POTASSIUM: 4.2 meq/L (ref 3.7–5.3)
Sodium: 137 mEq/L (ref 137–147)

## 2014-09-15 LAB — CBC
HCT: 38.3 % (ref 36.0–46.0)
Hemoglobin: 11.8 g/dL — ABNORMAL LOW (ref 12.0–15.0)
MCH: 25.4 pg — AB (ref 26.0–34.0)
MCHC: 30.8 g/dL (ref 30.0–36.0)
MCV: 82.4 fL (ref 78.0–100.0)
PLATELETS: 353 10*3/uL (ref 150–400)
RBC: 4.65 MIL/uL (ref 3.87–5.11)
RDW: 16.3 % — ABNORMAL HIGH (ref 11.5–15.5)
WBC: 15.7 10*3/uL — ABNORMAL HIGH (ref 4.0–10.5)

## 2014-09-15 MED ORDER — CYCLOBENZAPRINE HCL 5 MG PO TABS
5.0000 mg | ORAL_TABLET | Freq: Three times a day (TID) | ORAL | Status: DC | PRN
  Filled 2014-09-15: qty 1

## 2014-09-15 MED ORDER — IOHEXOL 300 MG/ML  SOLN
100.0000 mL | Freq: Once | INTRAMUSCULAR | Status: AC | PRN
  Administered 2014-09-15: 100 mL via INTRAVENOUS

## 2014-09-15 MED ORDER — PROCHLORPERAZINE EDISYLATE 5 MG/ML IJ SOLN
10.0000 mg | Freq: Four times a day (QID) | INTRAMUSCULAR | Status: DC | PRN
  Administered 2014-09-15 – 2014-09-16 (×3): 10 mg via INTRAVENOUS
  Filled 2014-09-15 (×3): qty 2

## 2014-09-15 MED ORDER — IOHEXOL 300 MG/ML  SOLN: 25.0000 mL | Freq: Once | INTRAMUSCULAR | Status: AC | PRN

## 2014-09-15 NOTE — Progress Notes (Signed)
Physical Therapy Treatment Patient Details Name: Constance HawShannon C Jambor MRN: [id number] DOB: 11/25/1986 Today's Date: 09/15/2014    History of Present Illness Pt is a [age] year old female with hx of cerebral palsy with spastic/ataxic diplegia admitted with sepsis with UTI, abdominal pain and reports decreased mobility over last 3 weeks.    PT Comments    Pt assisted to sitting and then required total assist +2 to lateral scoot into recliner (used drop arm recliner and stool for pt's foot support).  Pt repositioned multiple times in recliner to comfort and required assist to complete.  Pt now with NG tube.  Nsg staff aware to use lift for back to bed.   Follow Up Recommendations  Home health PT;Supervision/Assistance - 24 hour     Equipment Recommendations  None recommended by PT    Recommendations for Other Services       Precautions / Restrictions Precautions Precautions: Fall Required Braces or Orthoses: Other Brace/Splint Other Brace/Splint: reports bil AFOs at home    Mobility  Bed Mobility Overal bed mobility: Needs Assistance Bed Mobility: Supine to Sit     Supine to sit: Max assist;+2 for physical assistance     General bed mobility comments: pt able to assist more with leg movement however required increased assist for scooting and trunk upright  Transfers Overall transfer level: Needs assistance Equipment used: None Transfers: Lateral/Scoot Transfers          Lateral/Scoot Transfers: +2 physical assistance;Total assist;From elevated surface General transfer comment: pt required increased assist to scoot from higher bed surface to recliner (with dropped arm), pt attempted to assist however too weak  Ambulation/Gait                 Stairs            Wheelchair Mobility    Modified Rankin (Stroke Patients Only)       Balance Overall balance assessment: Needs assistance Sitting-balance support: Feet supported;Bilateral upper extremity  supported Sitting balance-Leahy Scale: Zero Sitting balance - Comments: appeared to require more trunk support today compared to last visit, increased need for BIL UE support, tends to lean to right side today Postural control: Right lateral lean                          Cognition Arousal/Alertness: Awake/alert Behavior During Therapy: WFL for tasks assessed/performed Overall Cognitive Status: History of cognitive impairments - at baseline                      Exercises      General Comments        Pertinent Vitals/Pain Pain Assessment: Faces Pain Score:  (pain not rated) Faces Pain Scale: Hurts even more Pain Location: abdomen Pain Descriptors / Indicators: Aching Pain Intervention(s): Limited activity within patient's tolerance;Monitored during session;Repositioned    Home Living                      Prior Function            PT Goals (current goals can now be found in the care plan section) Progress towards PT goals: Progressing toward goals    Frequency  Min 3X/week    PT Plan Current plan remains appropriate    Co-evaluation             End of Session   Activity Tolerance: Patient limited by pain;Patient limited by fatigue Patient left:  in chair;with call bell/phone within reach;with family/visitor present     Time: [id number] PT Time Calculation (min): 27 min  Charges:  $Therapeutic Activity: 23-37 mins                    G Codes:      Sophi Calligan,KATHrine E 09/15/2014, 4:38 PM Zenovia JarredKati Tynell Winchell, PT, DPT 09/15/2014 Pager: [phone]

## 2014-09-15 NOTE — Progress Notes (Addendum)
[facility] PROGRESS NOTE  Constance HawShannon C Worden JXB:[id number] DOB: 04/28/1986 DOA: 09/10/2014 PCP: Florentina JennyRIPP, HENRY, MD  Off Service Summary: [phone] with a hx of cerebral palsy and bladder reconstruction who initially presented with sepsis with UTI. Urine cultures have returned pan sensitive E.coli which is sensitive to rocephin. The patient tolerated abx, however during her course, the patient was noted to have worsened abd distension which initially responded to cathartics. Her distension returned and imaging has confirmed SBO in the setting of a post-operative abdomen. The patient has been continued on NG to low wall suction. General Surgery has been consulted with recommendations for conservative measures for now with consideration for transfer to a tertiary center given the complexity of her post-operative abdomen.  Assessment/Plan: 1. Sepsis with UTI  1. Blood cx neg x 2 2. Urine cx with >100,000 pan-sensitive ecoli 3. Leukocytosis had been improving, but slightly elevated today 4. Transitioned to ceftin 10/17 5. Switched back to IV rocephin on 10/18 secondary to SBO per below 2. Cerebral Palsy  1. Appears stable at present 2. Monitor for now 3. PT/OT consulted with recs for home health PT 3. Bipolar 1  1. Appears stable 4. DVT prophylaxis  1. Heparin subq 5. SBO with ileus 1. Cdiff neg 2. Xray abd with distended loops of bowel 3. Previously resolved after cathartics 4. Decreased BS 5. Not recently improved with mg citrate 6. Cont NPO 7. Placed NG to LWS 8. CT abd demonstrates SBO with post-surgical adhesions 9. Will consult General Surgery for assistance  Code Status: Full Family Communication: Pt in room Disposition Plan: Pending  Consultants:  General Surgery  Procedures:  Antibiotics:  Zosyn 10/14>>>10/15  Rocephin 10/15>>> 10/17>>>resumed 10/18  Ceftin 10/17>>>10/18  HPI/Subjective: Reports continued nausea despite NG to low wall  suction  Objective: Filed Vitals:   09/14/14 2133 09/15/14 0514 09/15/14 0947 09/15/14 1406  BP: 121/61 110/80  104/55  Pulse: 102 95  104  Temp: 97.8 F (36.6 C) 98.2 F (36.8 C)  98.3 F (36.8 C)  TempSrc: Axillary Axillary  Oral  Resp: 20 20  20   Height:      Weight:      SpO2: 95% 95% 97% 93%    Intake/Output Summary (Last 24 hours) at 09/15/14 1556 Last data filed at 09/15/14 1500  Gross per 24 hour  Intake   1910 ml  Output   2000 ml  Net    -90 ml   Filed Weights   09/10/14 1458  Weight: 83.008 kg (183 lb)    Exam:   General:  Awake, in nad, NG in place  Cardiovascular: tachycardic s1, s2  Respiratory: normal resp effort, no wheezing  Abdomen: distended, decreased BS  Musculoskeletal: perfused, no clubbing   Data Reviewed: Basic Metabolic Panel:  Recent Labs Lab 09/10/14 1102 09/11/14 0400 09/12/14 0415 09/13/14 0755 09/15/14 0421  NA 139 143 142 137 137  K 4.5 4.3 3.9 3.7 4.2  CL 106 107 108 101 102  CO2 21 21 25 25 22   GLUCOSE 116* 127* 116* 128* 104*  BUN 11 8 7 7 9   CREATININE 0.47* 0.48* 0.45* 0.45* 0.50  CALCIUM 9.7 9.1 9.2 9.3 9.5   Liver Function Tests:  Recent Labs Lab 09/10/14 1102 09/11/14 0400  AST 13 27  ALT 15 36*  ALKPHOS 123* 113  BILITOT <0.2* 0.2*  PROT 7.7 6.4  ALBUMIN 3.8 3.2*   No results found for this basename: LIPASE, AMYLASE,  in the last 168 hours No results found  for this basename: AMMONIA,  in the last 168 hours CBC:  Recent Labs Lab 09/10/14 1102 09/11/14 0400 09/12/14 0415 09/13/14 0755 09/15/14 0421  WBC 14.1* 15.3* 14.2* 13.3* 15.7*  HGB 12.2 10.5* 10.8* 10.4* 11.8*  HCT 38.4 34.3* 35.8* 34.0* 38.3  MCV 80.3 81.9 82.5 82.3 82.4  PLT 352 305 329 306 353   Cardiac Enzymes: No results found for this basename: CKTOTAL, CKMB, CKMBINDEX, TROPONINI,  in the last 168 hours BNP (last 3 results) No results found for this basename: PROBNP,  in the last 8760 hours CBG: No results found for this  basename: GLUCAP,  in the last 168 hours  Recent Results (from the past 240 hour(s))  CULTURE, BLOOD (ROUTINE X 2)     Status: None   Collection Time    09/10/14 10:57 AM      Result Value Ref Range Status   Specimen Description BLOOD LEFT ANTECUBITAL   Final   Special Requests BOTTLES DRAWN AEROBIC AND ANAEROBIC 5CC EACH   Final   Culture  Setup Time     Final   Value: 09/10/2014 14:44     Performed at [facility]   Culture     Final   Value:        BLOOD CULTURE RECEIVED NO GROWTH TO DATE CULTURE WILL BE HELD FOR 5 DAYS BEFORE ISSUING A FINAL NEGATIVE REPORT     Performed at [facility]   Report Status PENDING   Incomplete  CULTURE, BLOOD (ROUTINE X 2)     Status: None   Collection Time    09/10/14 11:02 AM      Result Value Ref Range Status   Specimen Description BLOOD RIGHT HAND   Final   Special Requests BOTTLES DRAWN AEROBIC AND ANAEROBIC   Final   Culture  Setup Time     Final   Value: 09/10/2014 14:44     Performed at [facility]   Culture     Final   Value:        BLOOD CULTURE RECEIVED NO GROWTH TO DATE CULTURE WILL BE HELD FOR 5 DAYS BEFORE ISSUING A FINAL NEGATIVE REPORT     Performed at [facility]   Report Status PENDING   Incomplete  URINE CULTURE     Status: None   Collection Time    09/10/14 11:29 AM      Result Value Ref Range Status   Specimen Description URINE, CATHETERIZED   Final   Special Requests NONE   Final   Culture  Setup Time     Final   Value: 09/10/2014 15:00     Performed at [facility] Count     Final   Value: >=100,000 COLONIES/ML     Performed at [facility]   Culture     Final   Value: ESCHERICHIA COLI     Performed at [facility]   Report Status 09/12/2014 FINAL   Final   Organism ID, Bacteria ESCHERICHIA COLI   Final  STOOL CULTURE     Status: None   Collection Time    09/11/14  8:40 PM      Result Value Ref Range Status   Specimen Description STOOL    Final   Special Requests NONE   Final   Culture     Final   Value: NO SALMONELLA, SHIGELLA, CAMPYLOBACTER, YERSINIA, OR E.COLI 0157:H7 ISOLATED     Performed at [facility]   Report Status 09/15/2014 FINAL   Final  CLOSTRIDIUM DIFFICILE BY PCR     Status: None   Collection Time    09/11/14  8:40 PM      Result Value Ref Range Status   C difficile by pcr NEGATIVE  NEGATIVE Final   Comment: Performed at [facility]     Studies: Ct Abdomen Pelvis W Contrast  09/15/2014   CLINICAL DATA:  Abdominal distension, a small bowel obstruction versus ileus  EXAM: CT ABDOMEN AND PELVIS WITH CONTRAST  TECHNIQUE: Multidetector CT imaging of the abdomen and pelvis was performed using the standard protocol following bolus administration of intravenous contrast.  CONTRAST:  OMNIPAQUE IOHEXOL 300 MG/ML  SOLN  COMPARISON:  09/14/2014  FINDINGS: Lung bases are free of acute infiltrate or sizable effusion. A nasogastric catheter is coiled within the stomach.  The gallbladder has been surgically removed. The liver, spleen, adrenal glands and pancreas are all normal in their CT appearance. The kidneys are well visualized bilaterally within normal enhancement pattern.  There are mildly dilated loops of small bowel identified in the jejunum and proximal ileum although a definitive transition zone is not well appreciated. Given the air and fecal material within the colon this likely represents a partial small bowel obstruction. Correlation with the physical exam is recommended.  The bladder is well distended. Postsurgical changes are noted in the proximal right femur. No pelvic mass lesion is seen. The appendix is not visualized consistent with the given clinical history. Postsurgical changes are noted in the ascending colon. A pain pump is identified in the right lateral abdominal wall with the catheter extending into the epidural space. No gross bony abnormality is noted.  IMPRESSION: Multiple dilated  loops of small bowel without definitive transition zone. These changes are likely related to partial small bowel obstruction secondary to adhesions given the multiple previous surgeries.  Previous surgical changes  No other acute abnormality is noted.   Electronically Signed   By: Alcide Clever M.D.   On: 09/15/2014 15:02   Dg Abd Portable 1v  09/14/2014   CLINICAL DATA:  Diffuse abdominal pain.  EXAM: PORTABLE ABDOMEN - 1 VIEW  COMPARISON:  09/10/2014.  FINDINGS: No significant change in mild diffuse gaseous dilatation of the small bowel. Decreased stool and gas in the colon, which is more normal in caliber today. Stable tube or catheter overlying the upper abdomen in the midline and on the right. Cholecystectomy clips. Mild scoliosis.  IMPRESSION: 1. Stable small bowel ileus or partial obstruction. 2. Decreased stool and gas in the colon.   Electronically Signed   By: Gordan Payment M.D.   On: 09/14/2014 11:36    Scheduled Meds: . budesonide-formoterol  2 puff Inhalation BID  . cefTRIAXone (ROCEPHIN)  IV  1 g Intravenous Q24H  . escitalopram  10 mg Oral Daily  . gabapentin  900 mg Oral TID  . heparin  5,000 Units Subcutaneous 3 times per day  . lithium carbonate  1,200 mg Oral QHS  . metoprolol  5 mg Intravenous 4 times per day  . OLANZapine  10 mg Oral QHS  . sodium chloride  3 mL Intravenous Q12H   Continuous Infusions: . sodium chloride 75 mL/hr at 09/14/14 2357    Principal Problem:   Sepsis due to urinary tract infection Active Problems:   Cerebral palsy with spastic/ataxic diplegia   Bipolar 1 disorder   Cerebral palsy   Sepsis secondary to UTI  Time spent:  35min  CHIU, STEPHEN K  [facility] Pager [phone]. If 7PM-7AM, please contact night-coverage at [email or web address], password [email or web address] 09/15/2014, 3:56 PM  LOS: 5 days

## 2014-09-15 NOTE — Consult Note (Signed)
[facility] Consult Note  DALARY HOLLAR 1986/03/13  [id number].    Requesting MD: Dr. Wyline Copas Chief Complaint/Reason for Consult: SBO  HPI:  28 y/o white female with a hx of cerebral pasly and frequent UTI's presents to [facility] on 09/10/14.  She was treated over the past month for pansensitive ecoli uti (positive urine cx on 08/17/14). Pt was reportedly treated with a course of keflex as well as rocephin prior to admit.  Sx persisted and the patient presented to the ED where she was noted to have a leukocytosis of 14k with tachycardia and thus was admitted and started on rocephin.  We were asked to see the patient after she started to develop abdominal pain and distension, nausea over the last few days.  An NG tube was placed and she is being treated conservatively.  She has a h/o chronic constipation with liquid stools.  No radiating pain, no alleviating/aggrevating factors.     She has had multiple urological surgeries including "urologic reconstruction".  She has to catheterize herself around her umbilicus to remove urine.  No records from [facility] are available in up to date.  Apparently during her hospitalization she rolled over in bed and "popped" her incisions and her wound dehisced and she was taken urgently back to the OR.  She is also S/p appendectomy, cholecystectomy, and baclofen pump in the RLQ.     ROS: All systems reviewed and otherwise negative except for as above  History reviewed. No pertinent family history.  Past Medical History  Diagnosis Date  . Asthma   . Bipolar 1 disorder   . Cerebral palsy with spastic/ataxic diplegia     spastic  . Pancreatitis   . Constipation     Past Surgical History  Procedure Laterality Date  . Appendectomy    . Cholecystectomy    . Major lower body reconstruction    . Eye surgery    . Baclofen pump refill      RLQ  . Urinary diversion      Social History:  reports that she has never smoked. She has never used smokeless  tobacco. She reports that she drinks about .6 ounces of alcohol per week. She reports that she does not use illicit drugs.  Allergies:  Allergies  Allergen Reactions  . Methadone Hcl Other (See Comments)    hypoxia  . Morphine And Related Other (See Comments)    hypoxia  . Trazodone And Nefazodone Other (See Comments)    Hard to awaken when takes this medication prefers not to take.  . Versed [Midazolam] Other (See Comments)    hypoxia    Medications Prior to Admission  Medication Sig Dispense Refill  . acetaminophen (TYLENOL) 500 MG tablet Take 1,000 mg by mouth every 6 (six) hours as needed for mild pain.      Marland Kitchen albuterol (PROVENTIL HFA;VENTOLIN HFA) 108 (90 BASE) MCG/ACT inhaler Inhale 1-2 puffs into the lungs every 6 (six) hours as needed for wheezing.  1 Inhaler  0  . budesonide-formoterol (SYMBICORT) 160-4.5 MCG/ACT inhaler Inhale 2 puffs into the lungs 2 (two) times daily.      . cyclobenzaprine (FLEXERIL) 5 MG tablet Take 5 mg by mouth 3 (three) times daily as needed for muscle spasms (pain).      Marland Kitchen escitalopram (LEXAPRO) 10 MG tablet Take 10 mg by mouth daily.      Marland Kitchen gabapentin (NEURONTIN) 300 MG capsule Take 900 mg by mouth 3 (three) times daily.      Marland Kitchen  hydrOXYzine (ATARAX/VISTARIL) 25 MG tablet Take 25 mg by mouth every 4 (four) hours as needed (anxiety).      . hyoscyamine (ANASPAZ) 0.125 MG TBDP disintergrating tablet Place 0.125 mg under the tongue 3 (three) times daily before meals.      . Influenza Vac Split Quad (FLUZONE) 0.25 ML injection Inject 0.25 mLs into the muscle once.      . lithium carbonate (LITHOBID) 300 MG CR tablet Take 1,200 mg by mouth at bedtime.      Marland Kitchen loratadine (CLARITIN) 10 MG tablet Take 10 mg by mouth daily.      Marland Kitchen OLANZapine (ZYPREXA) 10 MG tablet Take 10 mg by mouth at bedtime.      Marland Kitchen oxyCODONE (OXY IR/ROXICODONE) 5 MG immediate release tablet Take 5 mg by mouth every 6 (six) hours as needed for severe pain (pain).      . potassium chloride  (MICRO-K) 10 MEQ CR capsule Take 20 mEq by mouth every morning.       . triamcinolone (KENALOG) 0.025 % cream Apply 1 application topically 2 (two) times daily as needed (for rash). Uses on spot on bottom        Blood pressure 104/55, pulse 104, temperature 98.3 F (36.8 C), temperature source Oral, resp. rate 20, height '5\' 4"'  (1.626 m), weight 183 lb (83.008 kg), SpO2 93.00%. Physical Exam: General: pleasant, WD/WN white female who is laying in bed in NAD HEENT: head is normocephalic, atraumatic.  Sclera are noninjected.  PERRL.  Ears and nose without any masses or lesions.  Mouth is pink and moist Heart: regular, rate, and rhythm.  No obvious murmurs, gallops, or rubs noted.  Palpable pedal pulses bilaterally Lungs: CTAB, no wheezes, rhonchi, or rales noted.  Respiratory effort nonlabored Abd: soft, greatly distended with ventral hernia present, over old umbilicus site is her "bladder catheterization site", RLQ is a baclofen pump, +BS, no rebound or guarding. Scars in RLQ, midline.   MS: LE weak, upper extremities spastic, no tenderness to palpation Skin: warm and dry with no masses, lesions, or rashes Psych: A&Ox3 with an appropriate affect.   Results for orders placed during the hospital encounter of 09/10/14 (from the past 48 hour(s))  CBC     Status: Abnormal   Collection Time    09/15/14  4:21 AM      Result Value Ref Range   WBC 15.7 (*) 4.0 - 10.5 K/uL   RBC 4.65  3.87 - 5.11 MIL/uL   Hemoglobin 11.8 (*) 12.0 - 15.0 g/dL   HCT 38.3  36.0 - 46.0 %   MCV 82.4  78.0 - 100.0 fL   MCH 25.4 (*) 26.0 - 34.0 pg   MCHC 30.8  30.0 - 36.0 g/dL   RDW 16.3 (*) 11.5 - 15.5 %   Platelets 353  150 - 400 K/uL  BASIC METABOLIC PANEL     Status: Abnormal   Collection Time    09/15/14  4:21 AM      Result Value Ref Range   Sodium 137  137 - 147 mEq/L   Potassium 4.2  3.7 - 5.3 mEq/L   Chloride 102  96 - 112 mEq/L   CO2 22  19 - 32 mEq/L   Glucose, Bld 104 (*) 70 - 99 mg/dL   BUN 9  6 -  23 mg/dL   Creatinine, Ser 0.50  0.50 - 1.10 mg/dL   Calcium 9.5  8.4 - 10.5 mg/dL   GFR calc non Af Amer >90  >  90 mL/min   GFR calc Af Amer >90  >90 mL/min   Comment: (NOTE)     The eGFR has been calculated using the CKD EPI equation.     This calculation has not been validated in all clinical situations.     eGFR's persistently <90 mL/min signify possible Chronic Kidney     Disease.   Anion gap 13  5 - 15   Ct Abdomen Pelvis W Contrast  09/15/2014   CLINICAL DATA:  Abdominal distension, a small bowel obstruction versus ileus  EXAM: CT ABDOMEN AND PELVIS WITH CONTRAST  TECHNIQUE: Multidetector CT imaging of the abdomen and pelvis was performed using the standard protocol following bolus administration of intravenous contrast.  CONTRAST:  173m OMNIPAQUE IOHEXOL 300 MG/ML  SOLN  COMPARISON:  09/14/2014  FINDINGS: Lung bases are free of acute infiltrate or sizable effusion. A nasogastric catheter is coiled within the stomach.  The gallbladder has been surgically removed. The liver, spleen, adrenal glands and pancreas are all normal in their CT appearance. The kidneys are well visualized bilaterally within normal enhancement pattern.  There are mildly dilated loops of small bowel identified in the jejunum and proximal ileum although a definitive transition zone is not well appreciated. Given the air and fecal material within the colon this likely represents a partial small bowel obstruction. Correlation with the physical exam is recommended.  The bladder is well distended. Postsurgical changes are noted in the proximal right femur. No pelvic mass lesion is seen. The appendix is not visualized consistent with the given clinical history. Postsurgical changes are noted in the ascending colon. A pain pump is identified in the right lateral abdominal wall with the catheter extending into the epidural space. No gross bony abnormality is noted.  IMPRESSION: Multiple dilated loops of small bowel without  definitive transition zone. These changes are likely related to partial small bowel obstruction secondary to adhesions given the multiple previous surgeries.  Previous surgical changes  No other acute abnormality is noted.   Electronically Signed   By: MInez CatalinaM.D.   On: 09/15/2014 15:02   Dg Abd Portable 1v  09/14/2014   CLINICAL DATA:  Diffuse abdominal pain.  EXAM: PORTABLE ABDOMEN - 1 VIEW  COMPARISON:  09/10/2014.  FINDINGS: No significant change in mild diffuse gaseous dilatation of the small bowel. Decreased stool and gas in the colon, which is more normal in caliber today. Stable tube or catheter overlying the upper abdomen in the midline and on the right. Cholecystectomy clips. Mild scoliosis.  IMPRESSION: 1. Stable small bowel ileus or partial obstruction. 2. Decreased stool and gas in the colon.   Electronically Signed   By: SEnrique SackM.D.   On: 09/14/2014 11:36      Assessment/Plan SBO s/p [facility], h/o wound dehiscence) Nausea/vomiting Abdominal pain Cerebral palsy with spastic/ataxic diplegia H/o pancreatitis H/o chronic constipation Asthma Bipolar Type I with h/o SI   Plan: 1.  Need to treat with conservative management for now 2.  NPO, bowel rest, IVF, pain control, antiemetics 3.  Her past surgical history makes any future surgeries very difficult.  [place]  We may need to look into [facility]ing [facility] secondary to her very complicated intricate past surgical history.  May want to transfer sooner than later while she is stable. 4.  She is  not able to ambulate, but mobilizing with lift to the chair    Coralie Keens, [facility] 09/15/2014, 4:41 PM Pager: [phone]

## 2014-09-15 NOTE — Progress Notes (Signed)
Occupational Therapy Treatment Patient Details Name: Brianna Castillo C Negash MRN: [id number] DOB: 07/30/1986 Today's Date: 09/15/2014    History of present illness Pt is a 28 year old female with hx of cerebral palsy with spastic/ataxic diplegia admitted with sepsis with UTI, abdominal pain and reports decreased mobility over last 3 weeks.   OT comments  Issued yellow theraband and practiced exercises with pt. Will benefit from handout with exercises next visit and extra band to attach to bedrails. Pt pleased with being able to perform exercises today and educated pt on how to have caregiver assist with placement of band in hands if assist needed. Will follow for another educational visit to reinforce exercises for pt/caregiver to follow through with.   Follow Up Recommendations  No OT follow up;Supervision/Assistance - 24 hour    Equipment Recommendations  None recommended by OT    Recommendations for Other Services      Precautions / Restrictions Precautions Precautions: Fall Required Braces or Orthoses: Other Brace/Splint Other Brace/Splint: reports bil AFOs at home Restrictions Weight Bearing Restrictions: No       Mobility Bed Mobility                  Transfers                      Balance                                   ADL                                                Vision                     Perception     Praxis      Cognition                             Extremity/Trunk Assessment               Exercises Other Exercises Other Exercises: yellow theraband exercises; horizontal abduction X 10; elbow flexion X 10 bilateral, shoulder flexion X 10 bilateral with R UE weaker than L (decreased ROM with band with last few reps) Brief rest breaks between each exercise and between R and L of each exercise.    Shoulder Instructions       General Comments      Pertinent Vitals/  Pain          Home Living                                          Prior Functioning/Environment              Frequency Min 2X/week     Progress Toward Goals  OT Goals(current goals can now be found in the care plan section)  Progress towards OT goals: Progressing toward goals     Plan Discharge plan remains appropriate    Co-evaluation                 End of Session Equipment Utilized During Treatment: Other (comment) (theraband)  Activity Tolerance Patient tolerated treatment well   Patient Left in bed;with call bell/phone within reach   Nurse Communication          Time: 1010-1033 OT Time Calculation (min): 23 min  Charges: OT General Charges $OT Visit: 1 Procedure OT Treatments $Therapeutic Exercise: 23-37 mins  Lennox LaityStone, Hideo Googe Stafford [phone] 09/15/2014, 10:39 AM

## 2014-09-16 ENCOUNTER — Inpatient Hospital Stay (HOSPITAL_COMMUNITY): Payer: Medicaid Other

## 2014-09-16 DIAGNOSIS — G809 Cerebral palsy, unspecified: Secondary | ICD-10-CM

## 2014-09-16 LAB — BASIC METABOLIC PANEL
Anion gap: 17 — ABNORMAL HIGH (ref 5–15)
BUN: 8 mg/dL (ref 6–23)
CALCIUM: 9.6 mg/dL (ref 8.4–10.5)
CO2: 21 mEq/L (ref 19–32)
Chloride: 103 mEq/L (ref 96–112)
Creatinine, Ser: 0.4 mg/dL — ABNORMAL LOW (ref 0.50–1.10)
GFR calc non Af Amer: >90 mL/min (ref >90)
GLUCOSE: 101 mg/dL — AB (ref 70–99)
Potassium: 4.7 mEq/L (ref 3.7–5.3)
Sodium: 141 mEq/L (ref 137–147)

## 2014-09-16 LAB — CBC
HCT: 34.7 % — ABNORMAL LOW (ref 36.0–46.0)
Hemoglobin: 10.9 g/dL — ABNORMAL LOW (ref 12.0–15.0)
MCH: 25.6 pg — AB (ref 26.0–34.0)
MCHC: 31.4 g/dL (ref 30.0–36.0)
MCV: 81.6 fL (ref 78.0–100.0)
PLATELETS: 372 10*3/uL (ref 150–400)
RBC: 4.25 MIL/uL (ref 3.87–5.11)
RDW: 16.3 % — AB (ref 11.5–15.5)
WBC: 16.1 10*3/uL — AB (ref 4.0–10.5)

## 2014-09-16 LAB — CULTURE, BLOOD (ROUTINE X 2)
CULTURE: NO GROWTH
Culture: NO GROWTH

## 2014-09-16 MED ORDER — DIPHENHYDRAMINE HCL 50 MG/ML IJ SOLN
12.5000 mg | Freq: Four times a day (QID) | INTRAMUSCULAR | Status: DC | PRN
  Administered 2014-09-16: 12.5 mg via INTRAVENOUS
  Filled 2014-09-16: qty 1

## 2014-09-16 MED ORDER — BISACODYL 10 MG RE SUPP
10.0000 mg | Freq: Every day | RECTAL | Status: DC
  Administered 2014-09-17: 10 mg via RECTAL
  Filled 2014-09-16 (×2): qty 1

## 2014-09-16 NOTE — Progress Notes (Signed)
[facility] Progress Note     Subjective: Pt not feeling any better.  No significant nausea, but abdominal pain may be a little worse.  Pt says she would like to go to [facility] if given an option since they "saved her eyes" as a child.  Minimal flatus, 2 BM's recorded, but patient stated these were minimal.  WBC worsening.  Objective: Vital signs in last 24 hours: Temp:  [98.3 F (36.8 C)-98.6 F (37 C)] 98.5 F (36.9 C) (10/20 0432) Pulse Rate:  [99-104] 100 (10/20 0432) Resp:  [20] 20 (10/20 0432) BP: (104-142)/(55-102) 125/65 mmHg (10/20 0432) SpO2:  [93 %-98 %] 98 % (10/20 1005) Last BM Date: 09/15/14  Intake/Output from previous day: 10/19 0701 - 10/20 0700 In: 1960 [I.V.:1850; NG/GT:60; IV Piggyback:50] Out: 3175 [Urine:2625; Emesis/NG output:550] Intake/Output this shift:    PE: Gen:  Alert, NAD, pleasant Abd: Soft, distended, but maybe minimally softer, pain in upper abdomen and RLQ, +BS, no HSM, extensive scarring on the abdomen, ventral hernia felt   Lab Results:   Recent Labs  09/15/14 0421 09/16/14 0412  WBC 15.7* 16.1*  HGB 11.8* 10.9*  HCT 38.3 34.7*  PLT 353 372   BMET  Recent Labs  09/15/14 0421 09/16/14 0412  NA 137 141  K 4.2 4.7  CL 102 103  CO2 22 21  GLUCOSE 104* 101*  BUN 9 8  CREATININE 0.50 0.40*  CALCIUM 9.5 9.6   PT/INR No results found for this basename: LABPROT, INR,  in the last 72 hours CMP     Component Value Date/Time   NA 141 09/16/2014 0412   K 4.7 09/16/2014 0412   CL 103 09/16/2014 0412   CO2 21 09/16/2014 0412   GLUCOSE 101* 09/16/2014 0412   BUN 8 09/16/2014 0412   CREATININE 0.40* 09/16/2014 0412   CALCIUM 9.6 09/16/2014 0412   PROT 6.4 09/11/2014 0400   ALBUMIN 3.2* 09/11/2014 0400   AST 27 09/11/2014 0400   ALT 36* 09/11/2014 0400   ALKPHOS 113 09/11/2014 0400   BILITOT 0.2* 09/11/2014 0400   GFRNONAA >90 09/16/2014 0412   GFRAA >90 09/16/2014 0412   Lipase     Component Value Date/Time    LIPASE 17 06/30/2014 0018       Studies/Results: Ct Abdomen Pelvis W Contrast  09/15/2014   CLINICAL DATA:  Abdominal distension, a small bowel obstruction versus ileus  EXAM: CT ABDOMEN AND PELVIS WITH CONTRAST  TECHNIQUE: Multidetector CT imaging of the abdomen and pelvis was performed using the standard protocol following bolus administration of intravenous contrast.  CONTRAST:  100mL OMNIPAQUE IOHEXOL 300 MG/ML  SOLN  COMPARISON:  09/14/2014  FINDINGS: Lung bases are free of acute infiltrate or sizable effusion. A nasogastric catheter is coiled within the stomach.  The gallbladder has been surgically removed. The liver, spleen, adrenal glands and pancreas are all normal in their CT appearance. The kidneys are well visualized bilaterally within normal enhancement pattern.  There are mildly dilated loops of small bowel identified in the jejunum and proximal ileum although a definitive transition zone is not well appreciated. Given the air and fecal material within the colon this likely represents a partial small bowel obstruction. Correlation with the physical exam is recommended.  The bladder is well distended. Postsurgical changes are noted in the proximal right femur. No pelvic mass lesion is seen. The appendix is not visualized consistent with the given clinical history. Postsurgical changes are noted in the ascending colon. A pain pump  is identified in the right lateral abdominal wall with the catheter extending into the epidural space. No gross bony abnormality is noted.  IMPRESSION: Multiple dilated loops of small bowel without definitive transition zone. These changes are likely related to partial small bowel obstruction secondary to adhesions given the multiple previous surgeries.  Previous surgical changes  No other acute abnormality is noted.   Electronically Signed   By: Alcide Clever M.D.   On: 09/15/2014 15:02   Dg Abd Portable 2v  09/16/2014   CLINICAL DATA:  Abdominal pain and  distention, recheck small bowel obstruction  EXAM: PORTABLE ABDOMEN - 2 VIEW  COMPARISON:  CT abdomen and pelvis 09/15/2014  FINDINGS: Nasogastric tube coiled in proximal stomach.  Intraspinal catheter again noted.  Surgical clips RIGHT upper quadrant likely due to cholecystectomy.  Scattered gas within large and small bowel loops throughout abdomen.  No definite bowel wall thickening or point of obstruction identified.  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  IMPRESSION: Air-filled large and small bowel loops throughout abdomen without discrete transition point of obstruction.   Electronically Signed   By: Ulyses Southward M.D.   On: 09/16/2014 08:59    Anti-infectives: Anti-infectives   Start     Dose/Rate Route Frequency Ordered Stop   09/14/14 2200  cefTRIAXone (ROCEPHIN) 1 g in dextrose 5 % 50 mL IVPB     1 g 100 mL/hr over 30 Minutes Intravenous Every 24 hours 09/14/14 1416     09/13/14 1000  cefUROXime (CEFTIN) tablet 250 mg  Status:  Discontinued     250 mg Oral 2 times daily with meals 09/13/14 0853 09/14/14 1410   09/12/14 1400  cefTRIAXone (ROCEPHIN) 1 g in dextrose 5 % 50 mL IVPB  Status:  Discontinued     1 g 100 mL/hr over 30 Minutes Intravenous Every 24 hours 09/11/14 1957 09/13/14 0853   09/11/14 1330  cefTRIAXone (ROCEPHIN) 1 g in dextrose 5 % 50 mL IVPB     1 g 100 mL/hr over 30 Minutes Intravenous STAT 09/11/14 1317 09/11/14 1424   09/10/14 2200  piperacillin-tazobactam (ZOSYN) IVPB 3.375 g  Status:  Discontinued     3.375 g 12.5 mL/hr over 240 Minutes Intravenous Every 8 hours 09/10/14 1522 09/11/14 1259   09/10/14 1415  piperacillin-tazobactam (ZOSYN) IVPB 3.375 g     3.375 g 100 mL/hr over 30 Minutes Intravenous STAT 09/10/14 1403 09/10/14 1645   09/10/14 1215  cefTRIAXone (ROCEPHIN) 1 g in dextrose 5 % 50 mL IVPB     1 g 100 mL/hr over 30 Minutes  Intravenous  Once 09/10/14 1209 09/10/14 1312       Assessment/Plan SBO s/p multiple significant urological surgeries done at [facility] (?neo bladder, h/o wound dehiscence)  Nausea/vomiting  Abdominal pain  Cerebral palsy with spastic/ataxic diplegia  H/o pancreatitis  H/o chronic constipation  Asthma  Bipolar Type I with h/o SI  Leukocytosis - up to 16.1 today  Plan:  1. Need to treat with conservative management for now, no significant improvement today clinically or radiographically 2. NPO, bowel rest, IVF, pain control, antiemetics  3. Her past surgical history makes any future surgeries very difficult. I don't believe she would go back to [place] with the experience she has had there. We may need to look into transfering her to tertiary care center like [facility] or [facility] secondary to her very complicated intricate past surgical history. May  want to transfer sooner than later while she is stable.  4. She is not able to ambulate, but mobilizing with lift to the chair 5. Try dulcolax    LOS: 6 days    Aris GeorgiaDORT, Oza Oberle 09/16/2014, 11:47 AM Pager: [phone]

## 2014-09-16 NOTE — Consult Note (Signed)
Patient interviewed and examined, Imaging reviewed,agree with PA note above.   Appears to have partial small bowel obstruction secondary to adhesions. No immediate surgical indications. With her previous history and complex urologic procedures I believe she would be best served at a tertiary care center should she require surgery. Mariella SaaBenjamin T Lyberti Thrush MD, FACS  09/16/2014 5:41 PM

## 2014-09-16 NOTE — Treatment Plan (Signed)
Recs for transfer to tertiary care center noted. Discussed with the patient. She currently feels apprehensive about transfer and is concerned about any future surgeries. She would rather hold off on transfer until she can think about it overnight. Will hold off on contacting tertiary care center for now. Will follow and cont supportive care.

## 2014-09-16 NOTE — Progress Notes (Signed)
Patient interviewed and examined, agree with PA note above. She had a bowel movement earlier today. Abdomen is somewhat distended but soft and not particularly tender. She appears to have a partial obstruction but not an acute abdomen at this time. Continue conservative management. Because of her previous extensive surgical history and complex urologic problems I agree that [facility] should she not resolve nonoperatively.  Mariella SaaBenjamin T Lyly Canizales MD, FACS  09/16/2014 5:38 PM

## 2014-09-16 NOTE — Progress Notes (Signed)
[facility] PROGRESS NOTE  Brianna Castillo ZOX:[id number] DOB: 07/23/1986 DOA: 09/10/2014 PCP: Florentina JennyRIPP, HENRY, MD  Off Service Summary: [phone] with a hx of cerebral palsy and bladder reconstruction who initially presented with sepsis with UTI. Urine cultures have returned pan sensitive E.coli which is sensitive to rocephin. The patient tolerated abx, however during her course, the patient was noted to have worsened abd distension which initially responded to cathartics. Her distension returned and imaging has confirmed SBO in the setting of a post-operative abdomen. The patient has been continued on NG to low wall suction. General Surgery has been consulted with recommendations for conservative measures for now with consideration for transfer to a tertiary center given the complexity of her post-operative abdomen.  Assessment/Plan: 1. Sepsis with UTI  1. Blood cx neg x 2 2. Urine cx with >100,000 pan-sensitive ecoli 3. Leukocytosis had been improving, but slightly elevated today 4. Transitioned to ceftin 10/17 5. Switched back to IV rocephin on 10/18 secondary to SBO per below 2. Cerebral Palsy  1. Appears stable at present 2. Monitor for now 3. PT/OT consulted with recs for home health PT 3. Bipolar 1  1. Appears stable 4. DVT prophylaxis  1. Heparin subq 5. SBO with ileus 1. Cdiff neg 2. Xray abd with distended loops of bowel with no sig improvement on follow up xray 3. Previously resolved after cathartics 4. Decreased BS, but present 5. Not recently improved with mg citrate 6. Cont NPO 7. Placed NG to LWS 8. CT abd demonstrates SBO with post-surgical adhesions 9. Appreciate General Surgery input. Consideration for transfer to higher level of care  Code Status: Full Family Communication: Pt in room Disposition Plan: Pending  Consultants:  General Surgery  Procedures:  Antibiotics:  Zosyn 10/14>>>10/15  Rocephin 10/15>>> 10/17>>>resumed 10/18  Ceftin  10/17>>>10/18  HPI/Subjective: No significant improvement overnight  Objective: Filed Vitals:   09/15/14 2035 09/15/14 2258 09/16/14 0432 09/16/14 1005  BP: 113/66 142/102 125/65   Pulse: 102 99 100   Temp: 98.6 F (37 C)  98.5 F (36.9 C)   TempSrc: Axillary  Axillary   Resp: 20  20   Height:      Weight:      SpO2: 97%  94% 98%    Intake/Output Summary (Last 24 hours) at 09/16/14 1224 Last data filed at 09/16/14 0700  Gross per 24 hour  Intake   1960 ml  Output   2575 ml  Net   -615 ml   Filed Weights   09/10/14 1458  Weight: 83.008 kg (183 lb)    Exam:   General:  Awake, in nad, NG in place  Cardiovascular: tachycardic s1, s2  Respiratory: normal resp effort, no wheezing  Abdomen: distended, decreased BS  Musculoskeletal: perfused, no clubbing   Data Reviewed: Basic Metabolic Panel:  Recent Labs Lab 09/11/14 0400 09/12/14 0415 09/13/14 0755 09/15/14 0421 09/16/14 0412  NA 143 142 137 137 141  K 4.3 3.9 3.7 4.2 4.7  CL 107 108 101 102 103  CO2 21 25 25 22 21   GLUCOSE 127* 116* 128* 104* 101*  BUN 8 7 7 9 8   CREATININE 0.48* 0.45* 0.45* 0.50 0.40*  CALCIUM 9.1 9.2 9.3 9.5 9.6   Liver Function Tests:  Recent Labs Lab 09/10/14 1102 09/11/14 0400  AST 13 27  ALT 15 36*  ALKPHOS 123* 113  BILITOT <0.2* 0.2*  PROT 7.7 6.4  ALBUMIN 3.8 3.2*   No results found for this basename: LIPASE, AMYLASE,  in  the last 168 hours No results found for this basename: AMMONIA,  in the last 168 hours CBC:  Recent Labs Lab 09/11/14 0400 09/12/14 0415 09/13/14 0755 09/15/14 0421 09/16/14 0412  WBC 15.3* 14.2* 13.3* 15.7* 16.1*  HGB 10.5* 10.8* 10.4* 11.8* 10.9*  HCT 34.3* 35.8* 34.0* 38.3 34.7*  MCV 81.9 82.5 82.3 82.4 81.6  PLT 305 329 306 353 372   Cardiac Enzymes: No results found for this basename: CKTOTAL, CKMB, CKMBINDEX, TROPONINI,  in the last 168 hours BNP (last 3 results) No results found for this basename: PROBNP,  in the last 8760  hours CBG: No results found for this basename: GLUCAP,  in the last 168 hours  Recent Results (from the past 240 hour(s))  CULTURE, BLOOD (ROUTINE X 2)     Status: None   Collection Time    09/10/14 10:57 AM      Result Value Ref Range Status   Specimen Description BLOOD LEFT ANTECUBITAL   Final   Special Requests BOTTLES DRAWN AEROBIC AND ANAEROBIC [facility]5CC EACH   Final   Culture  Setup Time     Final   Value: 09/10/2014 14:44     [facility]   Culture     Final   Value: NO GROWTH 5 DAYS     [facility]formed at [facility]   Report Status 09/16/2014 FINAL   Final  CULTURE, BLOOD (ROUTINE X 2)     Status: None   Collection Time    09/10/14 11:02 AM      Result Value Ref Range Status   Specimen Description BLOOD RIGHT HAND   Final   Special Requests BOTTLES DRAWN AEROBIC AND ANAEROBIC 5ML   Final   Culture  Setup Time     Final   Value: 09/10/2014 14:44     Performed at [facility]   Culture     Final   Value: NO GROWTH 5 DAYS     Performed at [facility]   Report Status 09/16/2014 FINAL   Final  URINE CULTURE     Status: None   Collection Time    09/10/14 11:29 AM      Result Value Ref Range Status   Specimen Description URINE, CATHETERIZED   Final   Special Requests NONE   Final   Culture  Setup Time     Final   Value: 09/10/2014 15:00     Performed at [facility]   Colony Count     Final   Value: >=100,000 COLONIES/ML     Performed at [facility]   Culture     Final   Value: ESCHERICHIA COLI     Performed at [facility]   Report Status 09/12/2014 FINAL   Final   Organism ID, Bacteria ESCHERICHIA COLI   Final  STOOL CULTURE     Status: None   Collection Time    09/11/14  8:40 PM      Result Value Ref Range Status   Specimen Description STOOL   Final   Special Requests NONE   Final   Culture     Final   Value: NO SALMONELLA, SHIGELLA, CAMPYLOBACTER, YERSINIA, OR E.COLI 0157:H7 ISOLATED      Performed at [facility]   Report Status 09/15/2014 FINAL   Final  CLOSTRIDIUM DIFFICILE BY PCR     Status: None   Collection Time    09/11/14  8:40 PM  Result Value Ref Range Status   C difficile by pcr NEGATIVE  NEGATIVE Final   Comment: Performed at [facility]     Studies: Ct Abdomen Pelvis W Contrast  09/15/2014   CLINICAL DATA:  Abdominal distension, a small bowel obstruction versus ileus  EXAM: CT ABDOMEN AND PELVIS WITH CONTRAST  TECHNIQUE: Multidetector CT imaging of the abdomen and pelvis was performed using the standard protocol following bolus administration of intravenous contrast.  CONTRAST:  OMNIPAQUE IOHEXOL 300 MG/ML  SOLN  COMPARISON:  09/14/2014  FINDINGS: Lung bases are free of acute infiltrate or sizable effusion. A nasogastric catheter is coiled within the stomach.  The gallbladder has been surgically removed. The liver, spleen, adrenal glands and pancreas are all normal in their CT appearance. The kidneys are well visualized bilaterally within normal enhancement pattern.  There are mildly dilated loops of small bowel identified in the jejunum and proximal ileum although a definitive transition zone is not well appreciated. Given the air and fecal material within the colon this likely represents a partial small bowel obstruction. Correlation with the physical exam is recommended.  The bladder is well distended. Postsurgical changes are noted in the proximal right femur. No pelvic mass lesion is seen. The appendix is not visualized consistent with the given clinical history. Postsurgical changes are noted in the ascending colon. A pain pump is identified in the right lateral abdominal wall with the catheter extending into the epidural space. No gross bony abnormality is noted.  IMPRESSION: Multiple dilated loops of small bowel without definitive transition zone. These changes are likely related to partial small bowel obstruction secondary to adhesions  given the multiple previous surgeries.  Previous surgical changes  No other acute abnormality is noted.   Electronically Signed   By: Alcide Clever M.D.   On: 09/15/2014 15:02   Dg Abd Portable 2v  09/16/2014   CLINICAL DATA:  Abdominal pain and distention, recheck small bowel obstruction  EXAM: PORTABLE ABDOMEN - 2 VIEW  COMPARISON:  CT abdomen and pelvis 09/15/2014  FINDINGS: Nasogastric tube coiled in proximal stomach.  Intraspinal catheter again noted.  Surgical clips RIGHT upper quadrant likely due to cholecystectomy.  Scattered gas within large and small bowel loops throughout abdomen.  No definite bowel wall thickening or point of obstruction identified.  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  IMPRESSION: Air-filled large and small bowel loops throughout abdomen without discrete transition point of obstruction.   Electronically Signed   By: Ulyses Southward M.D.   On: 09/16/2014 08:59    Scheduled Meds: . bisacodyl  10 mg Rectal Daily  . budesonide-formoterol  2 puff Inhalation BID  . cefTRIAXone (ROCEPHIN)  IV  1 g Intravenous Q24H  . escitalopram  10 mg Oral Daily  . gabapentin  900 mg Oral TID  . heparin  5,000 Units Subcutaneous 3 times per day  . lithium carbonate  1,200 mg Oral QHS  . metoprolol  5 mg Intravenous 4 times per day  . OLANZapine  10 mg Oral QHS  . sodium chloride  3 mL Intravenous Q12H   Continuous Infusions: . sodium chloride 75 mL/hr at 09/16/14 1043    Principal Problem:   Sepsis due to urinary tract infection Active Problems:   Cerebral palsy with spastic/ataxic diplegia   Bipolar 1 disorder   Cerebral palsy   Sepsis secondary to UTI  Time spent:  Scotland Korver,  Scheryl Marten  [facility] Pager [phone]. If 7PM-7AM, please contact night-coverage at [email or web address], password [facility] 09/16/2014, 12:24 PM  LOS: 6 days

## 2014-09-17 ENCOUNTER — Inpatient Hospital Stay (HOSPITAL_COMMUNITY): Payer: Medicaid Other

## 2014-09-17 LAB — BASIC METABOLIC PANEL
Anion gap: 18 — ABNORMAL HIGH (ref 5–15)
BUN: 9 mg/dL (ref 6–23)
CHLORIDE: 104 meq/L (ref 96–112)
CO2: 18 mEq/L — ABNORMAL LOW (ref 19–32)
Calcium: 9.5 mg/dL (ref 8.4–10.5)
Creatinine, Ser: 0.39 mg/dL — ABNORMAL LOW (ref 0.50–1.10)
GFR calc Af Amer: >90 mL/min (ref >90)
Glucose, Bld: 93 mg/dL (ref 70–99)
POTASSIUM: 3.8 meq/L (ref 3.7–5.3)
SODIUM: 140 meq/L (ref 137–147)

## 2014-09-17 LAB — CBC
HEMATOCRIT: 33.8 % — AB (ref 36.0–46.0)
Hemoglobin: 10.4 g/dL — ABNORMAL LOW (ref 12.0–15.0)
MCH: 25.2 pg — ABNORMAL LOW (ref 26.0–34.0)
MCHC: 30.8 g/dL (ref 30.0–36.0)
MCV: 82 fL (ref 78.0–100.0)
Platelets: 339 10*3/uL (ref 150–400)
RBC: 4.12 MIL/uL (ref 3.87–5.11)
RDW: 16.2 % — AB (ref 11.5–15.5)
WBC: 15.3 10*3/uL — AB (ref 4.0–10.5)

## 2014-09-17 MED ORDER — HYDROMORPHONE HCL 1 MG/ML IJ SOLN: 0.5000 mg | Freq: Four times a day (QID) | INTRAMUSCULAR | Status: AC | PRN

## 2014-09-17 MED ORDER — LITHIUM CARBONATE ER 300 MG PO TBCR: 1200.0000 mg | EXTENDED_RELEASE_TABLET | Freq: Every day | ORAL | Status: AC

## 2014-09-17 MED ORDER — BISACODYL 10 MG RE SUPP: 10.0000 mg | Freq: Every day | RECTAL | Status: AC

## 2014-09-17 MED ORDER — CYCLOBENZAPRINE HCL 5 MG PO TABS: 5.0000 mg | ORAL_TABLET | Freq: Three times a day (TID) | ORAL | Status: AC | PRN

## 2014-09-17 MED ORDER — OLANZAPINE 10 MG PO TBDP
10.0000 mg | ORAL_TABLET | Freq: Every day | ORAL | Status: DC
  Administered 2014-09-17: 10 mg via ORAL
  Filled 2014-09-17: qty 1

## 2014-09-17 MED ORDER — PROCHLORPERAZINE EDISYLATE 5 MG/ML IJ SOLN: 10.0000 mg | Freq: Four times a day (QID) | INTRAMUSCULAR | Status: AC | PRN

## 2014-09-17 MED ORDER — SODIUM CHLORIDE 0.9 % IV SOLN: 75.0000 mL | INTRAVENOUS | Status: AC

## 2014-09-17 MED ORDER — OLANZAPINE 10 MG PO TBDP
10.0000 mg | ORAL_TABLET | Freq: Every day | ORAL | Status: AC
Start: 2014-09-17 — End: ?

## 2014-09-17 MED ORDER — LORAZEPAM 2 MG/ML IJ SOLN: 0.5000 mg | Freq: Four times a day (QID) | INTRAMUSCULAR | Status: AC | PRN

## 2014-09-17 MED ORDER — ACETAMINOPHEN 650 MG RE SUPP: 650.0000 mg | Freq: Four times a day (QID) | RECTAL | Status: AC | PRN

## 2014-09-17 MED ORDER — METOPROLOL TARTRATE 1 MG/ML IV SOLN: 5.0000 mg | Freq: Four times a day (QID) | INTRAVENOUS | Status: AC

## 2014-09-17 MED ORDER — OLANZAPINE 10 MG PO TABS: 10.0000 mg | ORAL_TABLET | Freq: Every day | ORAL | Status: AC

## 2014-09-17 MED ORDER — LORAZEPAM 2 MG/ML IJ SOLN: 0.5000 mg | Freq: Four times a day (QID) | INTRAMUSCULAR | Status: DC | PRN

## 2014-09-17 MED ORDER — LORAZEPAM 2 MG/ML IJ SOLN
0.5000 mg | Freq: Four times a day (QID) | INTRAMUSCULAR | Status: DC | PRN
  Administered 2014-09-17: 0.5 mg via INTRAVENOUS
  Filled 2014-09-17: qty 1

## 2014-09-17 MED ORDER — HYDROXYZINE HCL 25 MG PO TABS: 25.0000 mg | ORAL_TABLET | ORAL | Status: AC | PRN

## 2014-09-17 MED ORDER — GABAPENTIN 300 MG PO CAPS: 900.0000 mg | ORAL_CAPSULE | Freq: Three times a day (TID) | ORAL | Status: AC

## 2014-09-17 MED ORDER — PROCHLORPERAZINE EDISYLATE 5 MG/ML IJ SOLN
10.0000 mg | Freq: Four times a day (QID) | INTRAMUSCULAR | Status: DC | PRN
  Administered 2014-09-17: 10 mg via INTRAVENOUS
  Filled 2014-09-17: qty 2

## 2014-09-17 MED ORDER — PHENOL 1.4 % MT LIQD: 1.0000 | OROMUCOSAL | Status: AC | PRN

## 2014-09-17 MED ORDER — HEPARIN SODIUM (PORCINE) 5000 UNIT/ML IJ SOLN: 5000.0000 [IU] | Freq: Three times a day (TID) | INTRAMUSCULAR | Status: AC

## 2014-09-17 MED ORDER — ESCITALOPRAM OXALATE 10 MG PO TABS: 10.0000 mg | ORAL_TABLET | Freq: Every day | ORAL | Status: AC

## 2014-09-17 MED ORDER — DEXTROSE 5 % IV SOLN: 1.0000 g | INTRAVENOUS | Status: AC

## 2014-09-17 NOTE — Discharge Summary (Addendum)
Physician Discharge Summary  Brianna Castillo ZOX:[id number] DOB: 18-Nov-1986 DOA: 09/10/2014  PCP: [name], MD  Admit date: 09/10/2014 Discharge date: 09/17/2014  Time spent: >30 minutes  Recommendations for Outpatient Follow-up:  Will transfer to [facility]  Discharge Diagnoses:  Principal Problem:   Sepsis due to urinary tract infection Active Problems:   Cerebral palsy with spastic/ataxic diplegia   Bipolar 1 disorder   Cerebral palsy   Sepsis secondary to UTI   Discharge Condition: Stable. NGT in place and still with ongoing abd discomfort, distension and decrease/absent BS.  Diet recommendation: NPO  Filed Weights   09/10/14 1458  Weight: 83.008 kg (183 lb)    History of present illness:  28yo with a hx of cerebral palsy and bladder reconstruction who initially presented with sepsis with UTI. Urine cultures have returned pan sensitive E.coli which is sensitive to rocephin. The patient tolerated abx, however during her course, the patient was noted to have worsened abd distension which initially responded to cathartics. Her distension returned and imaging has confirmed SBO in the setting of a post-operative abdomen. The patient has been continued on NG to low wall suction. General Surgery has been consulted with recommendations for conservative measures for now with consideration for transfer to a tertiary center given the complexity of her post-operative abdomen.   Hospital Course:  1-SBO: appears to be secondary to adhesions. -slow to none improvement -continue NGT with intermittent suction -NPO -supportive care -transfer to [facility] as per General surgery recommendations -ongoing SBO/Ileus present for the last 4-5 days now.  2-sepsis due to E.coli UTI:  -blood cx neg X2 -continue rocephin (day 6/10) -afebrile and WBC's trending down  3-Cerebral palsy: appears at baseline -will continue home meds when able to tolerates PO's  4-Bipolar  disorder: no SI currently -will use ODT zyprexa while NPO -once able to tolerates PO meds resume lithium and lexapro as well -PRN ativan for anxiety  5-Leukocytosis: due to #1 and #2 -continue antibiotics -follow trend -non toxic appearance  Procedures:  See below for x-ray reports   Consultations:  Surgery  Discharge Exam: Filed Vitals:   09/17/14 1213  BP: 128/81  Pulse: 100  Temp:   Resp:     General: Alert, afebrile, complaining of abd pain; positive NGT with approx biliary Cardiovascular: S1 and S2, no rubs or gallops Respiratory: CTA bilaterally Abd: soft, mild distension, tender to palpation (diffusely); absent BS Extremities: no cyanosis or clubbing  Discharge Instructions You were cared for by a hospitalist during your hospital stay. If you have any questions about your discharge medications or the care you received while you were in the hospital after you are discharged, you can call the unit and asked to speak with the hospitalist on call if the hospitalist that took care of you is not available. Once you are discharged, your primary care physician will handle any further medical issues. Please note that NO REFILLS for any discharge medications will be authorized once you are discharged, as it is imperative that you return to your primary care physician (or establish a relationship with a primary care physician if you do not have one) for your aftercare needs so that they can reassess your need for medications and monitor your lab values.  Discharge Instructions   Discharge instructions    Complete by:  As directed   Further treatment to be determined by receiving service; patient is still NPO and with ongoing SBO Home meds to be resumed while able to  tolerates PO's          Current Discharge Medication List    START taking these medications   Details  acetaminophen (TYLENOL) 650 MG suppository Place 1 suppository (650 mg total) rectally every 6 (six)  hours as needed for mild pain (or Fever >/= 101). Qty: 12 suppository, Refills: 0    bisacodyl (DULCOLAX) 10 MG suppository Place 1 suppository (10 mg total) rectally daily. Qty: 12 suppository, Refills: 0    cefTRIAXone 1 g in dextrose 5 % 50 mL Inject 1 g into the vein daily.    heparin 5000 UNIT/ML injection Inject 1 mL (5,000 Units total) into the skin every 8 (eight) hours. Qty: 1 mL    HYDROmorphone (DILAUDID) 1 MG/ML injection Inject 0.5 mLs (0.5 mg total) into the vein every 6 (six) hours as needed for severe pain. Qty: 1 mL, Refills: 0    LORazepam (ATIVAN) 2 MG/ML injection Inject 0.25 mLs (0.5 mg total) into the vein every 6 (six) hours as needed for anxiety. Qty: 1 mL, Refills: 0    metoprolol (LOPRESSOR) 1 MG/ML injection Inject 5 mLs (5 mg total) into the vein every 6 (six) hours. Qty: 5 mL    OLANZapine zydis (ZYPREXA) 10 MG disintegrating tablet Take 1 tablet (10 mg total) by mouth daily.    phenol (CHLORASEPTIC) 1.4 % LIQD Use as directed 1 spray in the mouth or throat as needed for throat irritation / pain. Refills: 0    prochlorperazine (COMPAZINE) 5 MG/ML injection Inject 2 mLs (10 mg total) into the vein every 6 (six) hours as needed. Qty: 2 mL    sodium chloride 0.9 % infusion Inject 75 mLs into the vein continuous. Refills: 0      CONTINUE these medications which have CHANGED   Details  cyclobenzaprine (FLEXERIL) 5 MG tablet Take 1 tablet (5 mg total) by mouth 3 (three) times daily as needed for muscle spasms (pain). To be resume when tolerating PO's Qty: 30 tablet, Refills: 0    escitalopram (LEXAPRO) 10 MG tablet Take 1 tablet (10 mg total) by mouth daily. To be resumed when tolerating PO's    gabapentin (NEURONTIN) 300 MG capsule Take 3 capsules (900 mg total) by mouth 3 (three) times daily. To be resumed when tolerating PO's    hydrOXYzine (ATARAX/VISTARIL) 25 MG tablet Take 1 tablet (25 mg total) by mouth every 4 (four) hours as needed (anxiety).  To be resume when tolerating PO's Qty: 30 tablet, Refills: 0    lithium carbonate (LITHOBID) 300 MG CR tablet Take 4 tablets (1,200 mg total) by mouth at bedtime. To be resume when tolerating PO's    OLANZapine (ZYPREXA) 10 MG tablet Take 1 tablet (10 mg total) by mouth at bedtime. To be resume when tolerating PO's      CONTINUE these medications which have NOT CHANGED   Details  albuterol (PROVENTIL HFA;VENTOLIN HFA) 108 (90 BASE) MCG/ACT inhaler Inhale 1-2 puffs into the lungs every 6 (six) hours as needed for wheezing. Qty: 1 Inhaler, Refills: 0    budesonide-formoterol (SYMBICORT) 160-4.5 MCG/ACT inhaler Inhale 2 puffs into the lungs 2 (two) times daily.    triamcinolone (KENALOG) 0.025 % cream Apply 1 application topically 2 (two) times daily as needed (for rash). Uses on spot on bottom      STOP taking these medications     acetaminophen (TYLENOL) 500 MG tablet      hyoscyamine (ANASPAZ) 0.125 MG TBDP disintergrating tablet      Influenza  Vac Split Quad (FLUZONE) 0.25 ML injection      loratadine (CLARITIN) 10 MG tablet      oxyCODONE (OXY IR/ROXICODONE) 5 MG immediate release tablet      potassium chloride (MICRO-K) 10 MEQ CR capsule        Allergies  Allergen Reactions  . Methadone Hcl Other (See Comments)    hypoxia  . Morphine And Related Other (See Comments)    hypoxia  . Trazodone And Nefazodone Other (See Comments)    Hard to awaken when takes this medication prefers not to take.  . Versed [Midazolam] Other (See Comments)    hypoxia    The results of significant diagnostics from this hospitalization (including imaging, microbiology, ancillary and laboratory) are listed below for reference.    Significant Diagnostic Studies: Ct Abdomen Pelvis W Contrast  09/15/2014   CLINICAL DATA:  Abdominal distension, a small bowel obstruction versus ileus  EXAM: CT ABDOMEN AND PELVIS WITH CONTRAST  TECHNIQUE: Multidetector CT imaging of the abdomen and pelvis was  performed using the standard protocol following bolus administration of intravenous contrast.  CONTRAST:  100mL OMNIPAQUE IOHEXOL 300 MG/ML  SOLN  COMPARISON:  09/14/2014  FINDINGS: Lung bases are free of acute infiltrate or sizable effusion. A nasogastric catheter is coiled within the stomach.  The gallbladder has been surgically removed. The liver, spleen, adrenal glands and pancreas are all normal in their CT appearance. The kidneys are well visualized bilaterally within normal enhancement pattern.  There are mildly dilated loops of small bowel identified in the jejunum and proximal ileum although a definitive transition zone is not well appreciated. Given the air and fecal material within the colon this likely represents a partial small bowel obstruction. Correlation with the physical exam is recommended.  The bladder is well distended. Postsurgical changes are noted in the proximal right femur. No pelvic mass lesion is seen. The appendix is not visualized consistent with the given clinical history. Postsurgical changes are noted in the ascending colon. A pain pump is identified in the right lateral abdominal wall with the catheter extending into the epidural space. No gross bony abnormality is noted.  IMPRESSION: Multiple dilated loops of small bowel without definitive transition zone. These changes are likely related to partial small bowel obstruction secondary to adhesions given the multiple previous surgeries.  Previous surgical changes  No other acute abnormality is noted.   Electronically Signed   By: Alcide CleverMark  Lukens M.D.   On: 09/15/2014 15:02   Dg Abd Portable 1v  09/14/2014   CLINICAL DATA:  Diffuse abdominal pain.  EXAM: PORTABLE ABDOMEN - 1 VIEW  COMPARISON:  09/10/2014.  FINDINGS: No significant change in mild diffuse gaseous dilatation of the small bowel. Decreased stool and gas in the colon, which is more normal in caliber today. Stable tube or catheter overlying the upper abdomen in the midline  and on the right. Cholecystectomy clips. Mild scoliosis.  IMPRESSION: 1. Stable small bowel ileus or partial obstruction. 2. Decreased stool and gas in the colon.   Electronically Signed   By: Gordan PaymentSteve  Reid M.D.   On: 09/14/2014 11:36   Dg Abd Portable 1v  09/10/2014   CLINICAL DATA:  Upper abdominal pain.  Nausea for 3 days.  EXAM: PORTABLE ABDOMEN - 1 VIEW  COMPARISON:  CT 06/30/2014.  Abdomen series 8 12/2013  FINDINGS: Cyst with a dynamic ileus. Lucencies noted over the colon most likely related to stool however pneumatosis coli cannot be excluded. Follow-up abdominal series suggested. CT of  the abdomen pelvis can be obtained. No acute bony abnormality.  IMPRESSION: 1. Dilated loops of small and large bowel consistent with adynamic ileus. 2. Lucencies noted over the colon most likely relates to stool. Pneumatosis coli cannot be excluded. Continued follow-up abdominal series suggested to demonstrate clearing of these findings. If need be CT of the abdomen pelvis can be obtained to further evaluate.   Electronically Signed   By: Maisie Fushomas  Register   On: 09/10/2014 14:40   Dg Abd Portable 2v  09/17/2014   CLINICAL DATA:  Small bowel obstruction, re-evaluate  EXAM: PORTABLE ABDOMEN - 2 VIEW  COMPARISON:  Abdomen films of 09/16/2014  FINDINGS: There is little change in persistent gaseous distention of large and small bowel diffusely. This may represent ileus, but a partial bowel obstruction cannot be excluded. No free air is seen on the decubitus image.  IMPRESSION: Gaseous distention of large and small bowel. Possible ileus but cannot exclude partial bowel obstruction. No free air.   Electronically Signed   By: Dwyane DeePaul  Barry M.D.   On: 09/17/2014 08:06   Dg Abd Portable 2v  09/16/2014   CLINICAL DATA:  Abdominal pain and distention, recheck small bowel obstruction  EXAM: PORTABLE ABDOMEN - 2 VIEW  COMPARISON:  CT abdomen and pelvis 09/15/2014  FINDINGS: Nasogastric tube coiled in proximal stomach.  Intraspinal  catheter again noted.  Surgical clips RIGHT upper quadrant likely due to cholecystectomy.  Scattered gas within large and small bowel loops throughout abdomen.  No definite bowel wall thickening or point of obstruction identified.  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  IMPRESSION: Air-filled large and small bowel loops throughout abdomen without discrete transition point of obstruction.   Electronically Signed   By: Ulyses SouthwardMark  Boles M.D.   On: 09/16/2014 08:59    Microbiology: Recent Results (from the past 240 hour(s))  CULTURE, BLOOD (ROUTINE X 2)     Status: None   Collection Time    09/10/14 10:57 AM      Result Value Ref Range Status   Specimen Description BLOOD LEFT ANTECUBITAL   Final   Special Requests BOTTLES DRAWN AEROBIC AND ANAEROBIC [place]5CC EACH   Final   Culture  Setup Time     Final   Value: 09/10/2014 14:44     Performed at [facility]   Culture     Final   Value: NO GROWTH 5 DAYS     Performed at [facility]   Report Status 09/16/2014 FINAL   Final  CULTURE, BLOOD (ROUTINE X 2)     Status: None   Collection Time    09/10/14 11:02 AM      Result Value Ref Range Status   Specimen Description BLOOD RIGHT HAND   Final   Special Requests BOTTLES DRAWN AEROBIC AND ANAEROBIC 5ML   Final   Culture  Setup Time     Final   Value: 09/10/2014 14:44     Performed at [facility]   Culture     Final   Value: NO GROWTH 5 DAYS     Performed at [facility]   Report Status 09/16/2014 FINAL   Final  URINE CULTURE     Status: None   Collection Time    09/10/14 11:29 AM      Result Value Ref Range Status   Specimen Description URINE, CATHETERIZED   Final   Special Requests  NONE   Final   Culture  Setup Time     Final   Value: 09/10/2014 15:00     Performed at [facility] Count     Final   Value: >=100,000  COLONIES/ML     Performed at [facility]   Culture     Final   Value: ESCHERICHIA COLI     Performed at [facility]   Report Status 09/12/2014 FINAL   Final   Organism ID, Bacteria ESCHERICHIA COLI   Final  STOOL CULTURE     Status: None   Collection Time    09/11/14  8:40 PM      Result Value Ref Range Status   Specimen Description STOOL   Final   Special Requests NONE   Final   Culture     Final   Value: NO SALMONELLA, SHIGELLA, CAMPYLOBACTER, YERSINIA, OR E.COLI 0157:H7 ISOLATED     Performed at [facility]   Report Status 09/15/2014 FINAL   Final  CLOSTRIDIUM DIFFICILE BY PCR     Status: None   Collection Time    09/11/14  8:40 PM      Result Value Ref Range Status   C difficile by pcr NEGATIVE  NEGATIVE Final   Comment: Performed at [facility]     Labs: Basic Metabolic Panel:  Recent Labs Lab 09/12/14 0415 09/13/14 0755 09/15/14 0421 09/16/14 0412 09/17/14 0401  NA 142 137 137 141 140  K 3.9 3.7 4.2 4.7 3.8  CL 108 101 102 103 104  CO2 25 25 22 21  18*  GLUCOSE 116* 128* 104* 101* 93  BUN 7 7 9 8 9   CREATININE 0.45* 0.45* 0.50 0.40* 0.39*  CALCIUM 9.2 9.3 9.5 9.6 9.5   Liver Function Tests:  Recent Labs Lab 09/11/14 0400  AST 27  ALT 36*  ALKPHOS 113  BILITOT 0.2*  PROT 6.4  ALBUMIN 3.2*   CBC:  Recent Labs Lab 09/12/14 0415 09/13/14 0755 09/15/14 0421 09/16/14 0412 09/17/14 0401  WBC 14.2* 13.3* 15.7* 16.1* 15.3*  HGB 10.8* 10.4* 11.8* 10.9* 10.4*  HCT 35.8* 34.0* 38.3 34.7* 33.8*  MCV 82.5 82.3 82.4 81.6 82.0  PLT 329 306 353 372 339    Signed:  Vassie Loll  [facility] 09/17/2014, 1:01 PM

## 2014-09-17 NOTE — Progress Notes (Signed)
[facility] picking up pt. Pt request med for nausea. Compazine 10mg  IV given. NGT clamped for transport. Called Amy, RN at [facility] to inform her that pt was leaving hospital, of meds given, and that pt needs I&O cath on arrival. Amy verbalized understanding. Pt stable for transport.

## 2014-09-17 NOTE — Progress Notes (Addendum)
[facility]. Report called to Halford ChessmanAmy Hawn , [facility] at [facility]. [facility] called and transport arranged. Pt aware of discharge process. Will continue to monitor. [facility] at [facility] given a detailed report, including detailed instructions on how to I&O cath pt's [place] pouch to drain her urine. Amy, RN repeated instructions back, to verbalize understanding.

## 2014-09-17 NOTE — Progress Notes (Signed)
Patient ID: Brianna Castillo, female   DOB: 1985-12-30, 28 y.o.   MRN: [id number]     [place]      [address] [place], [place] [address]    Phone: [phone] FAX: [phone]     Subjective: Loose BM, loose.  No n/v.  366m 24h, light bilious.    Objective:  Vital signs:  Filed Vitals:   09/16/14 1359 09/16/14 2207 09/17/14 0413 09/17/14 1148  BP: 114/73  113/80   Pulse: 82  93   Temp: 98.6 F (37 C)  98.7 F (37.1 C)   TempSrc: Axillary  Oral   Resp:   20   Height:      Weight:      SpO2: 93% 94% 98% 96%    Last BM Date: 09/16/14  Intake/Output   Yesterday:  10/20 0701 - 10/21 0700 In: 1782.5 [I.V.:1672.5; NG/GT:60; IV Piggyback:50] Out: 2701 [Urine:2400; Emesis/NG output:300; Stool:1] This shift:  Total I/O In: -  Out: 320 [Urine:320]   Physical Exam: General: Pt awake/alert/oriented x4 in no acute distress Abdomen: Soft.  distended.  Nontender.  No evidence of peritonitis.  No incarcerated hernias.    Problem List:   Principal Problem:   Sepsis due to urinary tract infection Active Problems:   Cerebral palsy with spastic/ataxic diplegia   Bipolar 1 disorder   Cerebral palsy   Sepsis secondary to UTI    Results:   Labs: Results for orders placed during the hospital encounter of 09/10/14 (from the past 48 hour(s))  BASIC METABOLIC PANEL     Status: Abnormal   Collection Time    09/16/14  4:12 AM      Result Value Ref Range   Sodium 141  137 - 147 mEq/L   Potassium 4.7  3.7 - 5.3 mEq/L   Comment: MODERATE HEMOLYSIS     HEMOLYSIS AT THIS LEVEL MAY AFFECT RESULT   Chloride 103  96 - 112 mEq/L   CO2 21  19 - 32 mEq/L   Glucose, Bld 101 (*) 70 - 99 mg/dL   BUN 8  6 - 23 mg/dL   Creatinine, Ser 0.40 (*) 0.50 - 1.10 mg/dL   Calcium 9.6  8.4 - 10.5 mg/dL   GFR calc non Af Amer >90  >90 mL/min   GFR calc Af Amer >90  >90 mL/min   Comment: (NOTE)     The eGFR has been calculated using the  CKD EPI equation.     This calculation has not been validated in all clinical situations.     eGFR's persistently <90 mL/min signify possible Chronic Kidney     Disease.   Anion gap 17 (*) 5 - 15  CBC     Status: Abnormal   Collection Time    09/16/14  4:12 AM      Result Value Ref Range   WBC 16.1 (*) 4.0 - 10.5 K/uL   RBC 4.25  3.87 - 5.11 MIL/uL   Hemoglobin 10.9 (*) 12.0 - 15.0 g/dL   HCT 34.7 (*) 36.0 - 46.0 %   MCV 81.6  78.0 - 100.0 fL   MCH 25.6 (*) 26.0 - 34.0 pg   MCHC 31.4  30.0 - 36.0 g/dL   RDW 16.3 (*) 11.5 - 15.5 %   Platelets 372  150 - 400 K/uL  CBC     Status: Abnormal   Collection Time    09/17/14  4:01 AM  Result Value Ref Range   WBC 15.3 (*) 4.0 - 10.5 K/uL   RBC 4.12  3.87 - 5.11 MIL/uL   Hemoglobin 10.4 (*) 12.0 - 15.0 g/dL   HCT 33.8 (*) 36.0 - 46.0 %   MCV 82.0  78.0 - 100.0 fL   MCH 25.2 (*) 26.0 - 34.0 pg   MCHC 30.8  30.0 - 36.0 g/dL   RDW 16.2 (*) 11.5 - 15.5 %   Platelets 339  150 - 400 K/uL  BASIC METABOLIC PANEL     Status: Abnormal   Collection Time    09/17/14  4:01 AM      Result Value Ref Range   Sodium 140  137 - 147 mEq/L   Potassium 3.8  3.7 - 5.3 mEq/L   Comment: DELTA CHECK NOTED     REPEATED TO VERIFY   Chloride 104  96 - 112 mEq/L   CO2 18 (*) 19 - 32 mEq/L   Glucose, Bld 93  70 - 99 mg/dL   BUN 9  6 - 23 mg/dL   Creatinine, Ser 0.39 (*) 0.50 - 1.10 mg/dL   Calcium 9.5  8.4 - 10.5 mg/dL   GFR calc non Af Amer >90  >90 mL/min   GFR calc Af Amer >90  >90 mL/min   Comment: (NOTE)     The eGFR has been calculated using the CKD EPI equation.     This calculation has not been validated in all clinical situations.     eGFR's persistently <90 mL/min signify possible Chronic Kidney     Disease.   Anion gap 18 (*) 5 - 15    Imaging / Studies: Ct Abdomen Pelvis W Contrast  09/15/2014   CLINICAL DATA:  Abdominal distension, a small bowel obstruction versus ileus  EXAM: CT ABDOMEN AND PELVIS WITH CONTRAST  TECHNIQUE:  Multidetector CT imaging of the abdomen and pelvis was performed using the standard protocol following bolus administration of intravenous contrast.  CONTRAST:  116mL OMNIPAQUE IOHEXOL 300 MG/ML  SOLN  COMPARISON:  09/14/2014  FINDINGS: Lung bases are free of acute infiltrate or sizable effusion. A nasogastric catheter is coiled within the stomach.  The gallbladder has been surgically removed. The liver, spleen, adrenal glands and pancreas are all normal in their CT appearance. The kidneys are well visualized bilaterally within normal enhancement pattern.  There are mildly dilated loops of small bowel identified in the jejunum and proximal ileum although a definitive transition zone is not well appreciated. Given the air and fecal material within the colon this likely represents a partial small bowel obstruction. Correlation with the physical exam is recommended.  The bladder is well distended. Postsurgical changes are noted in the proximal right femur. No pelvic mass lesion is seen. The appendix is not visualized consistent with the given clinical history. Postsurgical changes are noted in the ascending colon. A pain pump is identified in the right lateral abdominal wall with the catheter extending into the epidural space. No gross bony abnormality is noted.  IMPRESSION: Multiple dilated loops of small bowel without definitive transition zone. These changes are likely related to partial small bowel obstruction secondary to adhesions given the multiple previous surgeries.  Previous surgical changes  No other acute abnormality is noted.   Electronically Signed   By: Inez Catalina M.D.   On: 09/15/2014 15:02   Dg Abd Portable 2v  09/17/2014   CLINICAL DATA:  Small bowel obstruction, re-evaluate  EXAM: PORTABLE ABDOMEN - 2 VIEW  COMPARISON:  Abdomen films of 09/16/2014  FINDINGS: There is little change in persistent gaseous distention of large and small bowel diffusely. This may represent ileus, but a partial bowel  obstruction cannot be excluded. No free air is seen on the decubitus image.  IMPRESSION: Gaseous distention of large and small bowel. Possible ileus but cannot exclude partial bowel obstruction. No free air.   Electronically Signed   By: Ivar Drape M.D.   On: 09/17/2014 08:06   Dg Abd Portable 2v  09/16/2014   CLINICAL DATA:  Abdominal pain and distention, recheck small bowel obstruction  EXAM: PORTABLE ABDOMEN - 2 VIEW  COMPARISON:  CT abdomen and pelvis 09/15/2014  FINDINGS: Nasogastric tube coiled in proximal stomach.  Intraspinal catheter again noted.  Surgical clips RIGHT upper quadrant likely due to cholecystectomy.  Scattered gas within large and small bowel loops throughout abdomen.  No definite bowel wall thickening or point of obstruction identified.  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  IMPRESSION: Air-filled large and small bowel loops throughout abdomen without discrete transition point of obstruction.   Electronically Signed   By: Lavonia Dana M.D.   On: 09/16/2014 08:59    Medications / Allergies:  Scheduled Meds: . bisacodyl  10 mg Rectal Daily  . budesonide-formoterol  2 puff Inhalation BID  . cefTRIAXone (ROCEPHIN)  IV  1 g Intravenous Q24H  . escitalopram  10 mg Oral Daily  . gabapentin  900 mg Oral TID  . heparin  5,000 Units Subcutaneous 3 times per day  . lithium carbonate  1,200 mg Oral QHS  . metoprolol  5 mg Intravenous 4 times per day  . OLANZapine  10 mg Oral QHS  . sodium chloride  3 mL Intravenous Q12H   Continuous Infusions: . sodium chloride 75 mL/hr at 09/17/14 1133   PRN Meds:.acetaminophen, acetaminophen, albuterol, cyclobenzaprine, diphenhydrAMINE, HYDROmorphone (DILAUDID) injection, hydrOXYzine, labetalol, magic mouthwash w/lidocaine, phenol, prochlorperazine  Antibiotics: Anti-infectives   Start     Dose/Rate Route Frequency  Ordered Stop   09/14/14 2200  cefTRIAXone (ROCEPHIN) 1 g in dextrose 5 % 50 mL IVPB     1 g 100 mL/hr over 30 Minutes Intravenous Every 24 hours 09/14/14 1416     09/13/14 1000  cefUROXime (CEFTIN) tablet 250 mg  Status:  Discontinued     250 mg Oral 2 times daily with meals 09/13/14 0853 09/14/14 1410   09/12/14 1400  cefTRIAXone (ROCEPHIN) 1 g in dextrose 5 % 50 mL IVPB  Status:  Discontinued     1 g 100 mL/hr over 30 Minutes Intravenous Every 24 hours 09/11/14 1957 09/13/14 0853   09/11/14 1330  cefTRIAXone (ROCEPHIN) 1 g in dextrose 5 % 50 mL IVPB     1 g 100 mL/hr over 30 Minutes Intravenous STAT 09/11/14 1317 09/11/14 1424   09/10/14 2200  piperacillin-tazobactam (ZOSYN) IVPB 3.375 g  Status:  Discontinued     3.375 g 12.5 mL/hr over 240 Minutes Intravenous Every 8 hours 09/10/14 1522 09/11/14 1259   09/10/14 1415  piperacillin-tazobactam (ZOSYN) IVPB 3.375 g     3.375 g 100 mL/hr over 30 Minutes Intravenous STAT 09/10/14 1403 09/10/14 1645   09/10/14 1215  cefTRIAXone (ROCEPHIN) 1 g in dextrose 5 % 50 mL IVPB     1 g 100 mL/hr over 30 Minutes Intravenous  Once 09/10/14 1209 09/10/14 1312        Assessment/Plan SBO s/p multiple  significant urological surgeries done at [facility] (?neo bladder, h/o wound dehiscence)  Nausea/vomiting  Abdominal pain  Cerebral palsy with spastic/ataxic diplegia  H/o pancreatitis  H/o chronic constipation  Asthma  Bipolar Type I with h/o SI I think she is improving slowly, although it is difficult to obtain accurate history from her.  Agree with dulcolax suppository.  NGT output is down to 384m light bilious.  AXR look perhaps a bit better today.  Will continue with NGT decompression.  She has been treated conservatively for 7 days.  I think she would best be served at a tertiary care facility should she require surgical intervention.  Dispo per primary team.    EErby Pian [facility] Pager  [phone])   09/17/2014 12:00 PM

## 2014-09-17 NOTE — Progress Notes (Signed)
Call placed to radiology to request a disc to include all completed scans to be sent with pt upon transfer. Brianna Castillo, Brianna Castillo

## 2014-09-17 NOTE — Progress Notes (Signed)
OT Cancellation Note  Patient Details Name: Constance HawShannon C Labree MRN: [id number] DOB: 12/14/1985   Cancelled Treatment:    Reason Eval/Treat Not Completed: Other (comment) Pt with nursing present in room when OT checked by earlier. Will reattempt this pm as schedule allows or in am.   Lennox LaityStone, Dusten Ellinwood [place] [phone] 09/17/2014, 11:56 AM

## 2014-10-08 ENCOUNTER — Ambulatory Visit: Payer: 59 | Admitting: Physical Therapy

## 2015-01-27 DEATH — deceased

## 2015-08-01 IMAGING — CR DG ABD PORTABLE 1V
1 series · 1 of 1 positions shown · non-contrast
Comparison: 09/10/2014.

CLINICAL DATA: Diffuse abdominal pain.

EXAM:
PORTABLE ABDOMEN - 1 VIEW

[ap (kub)]
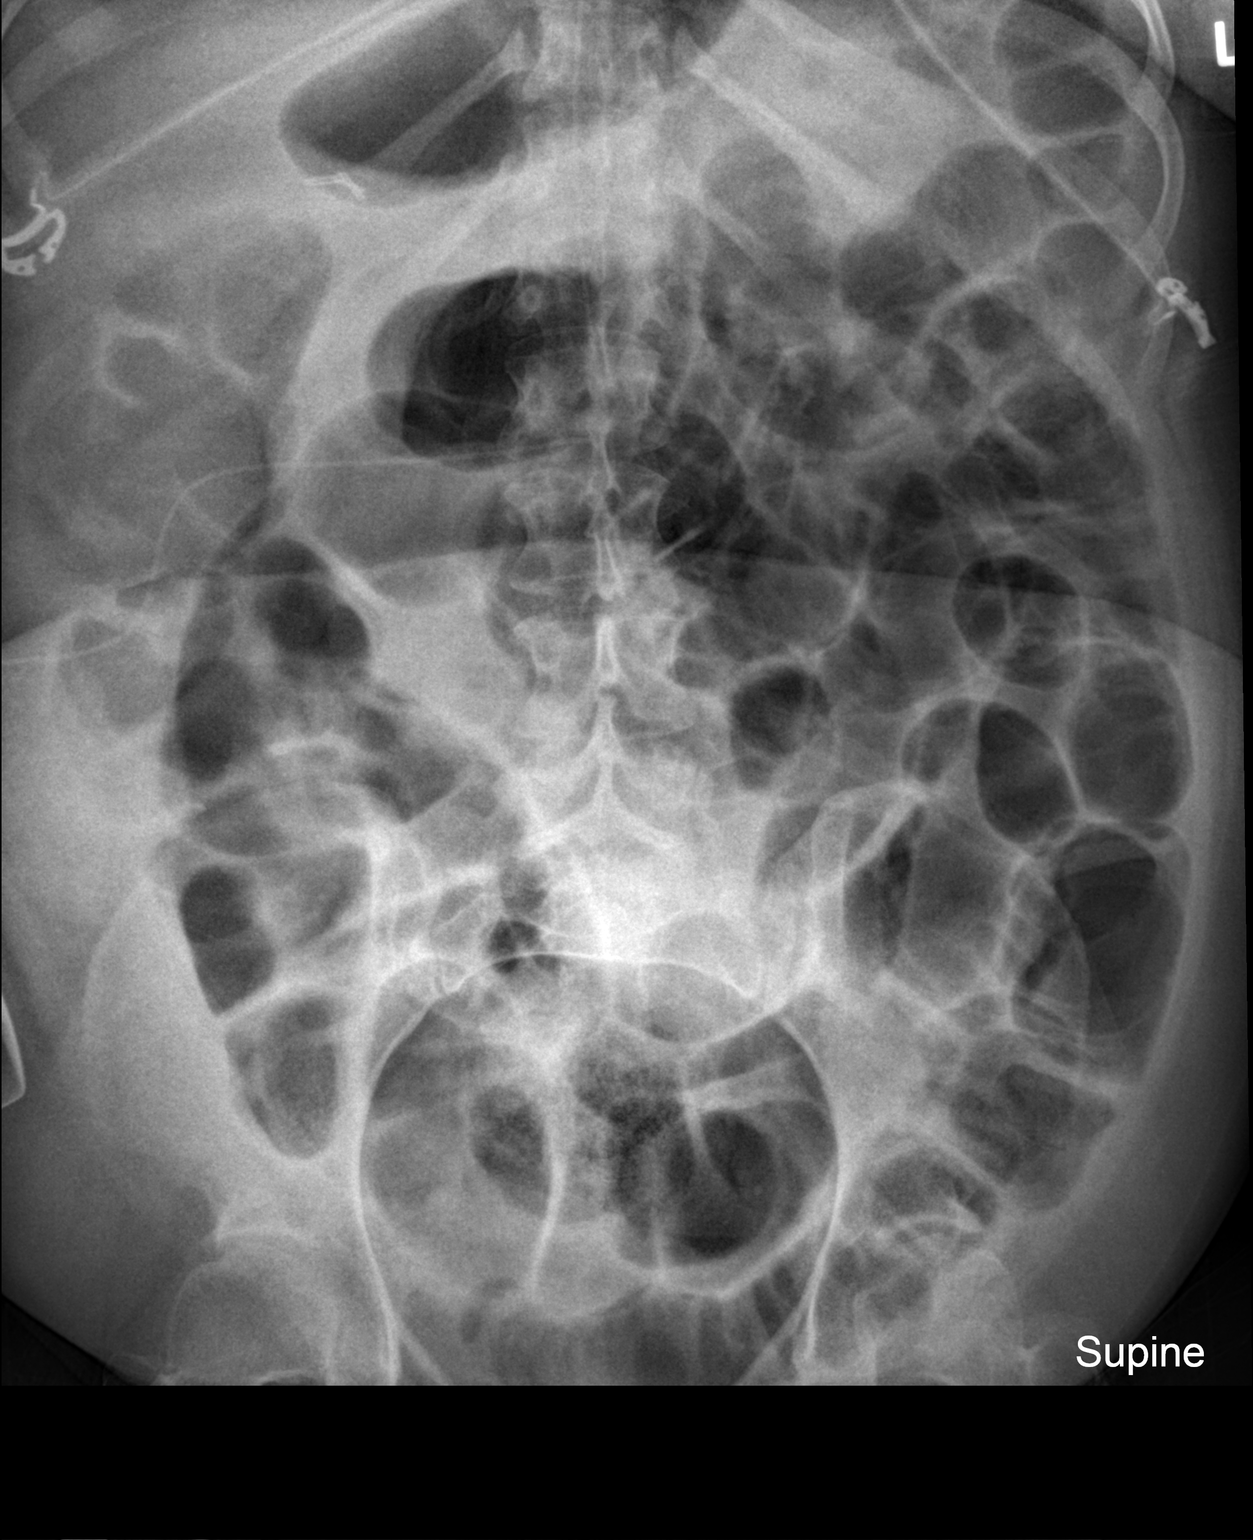

[1 of 1 positions shown; findings below may reference images not displayed]

FINDINGS: No significant change in mild diffuse gaseous dilatation of the
small bowel. Decreased stool and gas in the colon, which is more
normal in caliber today. Stable tube or catheter overlying the upper
abdomen in the midline and on the right. Cholecystectomy clips. Mild
scoliosis.
IMPRESSION: 1. Stable small bowel ileus or partial obstruction.
2. Decreased stool and gas in the colon.

## 2015-08-02 IMAGING — CT CT ABD-PELV W/ CM
1 series · 15 of 32 positions shown, 19 images · IV contrast (omnipaque)
Comparison: 09/14/2014

CLINICAL DATA: Abdominal distension, a small bowel obstruction
versus ileus

EXAM:
CT ABDOMEN AND PELVIS WITH CONTRAST
TECHNIQUE: Multidetector CT imaging of the abdomen and pelvis was performed
using the standard protocol following bolus administration of
intravenous contrast.
CONTRAST:  100mL OMNIPAQUE IOHEXOL 300 MG/ML  SOLN

[Series 2: rtn ap with st · axial · 0.86mm/px · z∈[+742,+1157]mm · 15 of 92 slices shown, 19 images]
[im 6/92  soft-tissue]
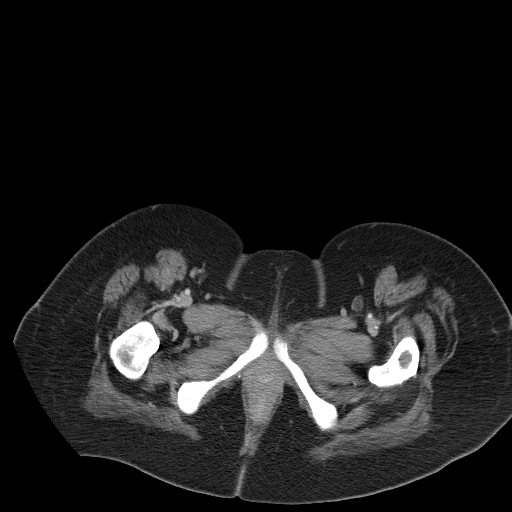
[im 6/92  bone]
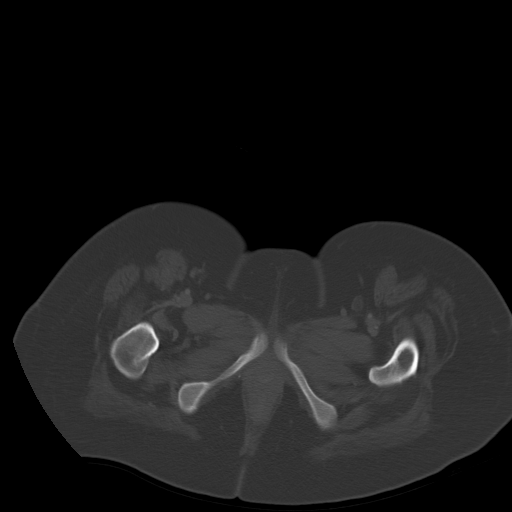
[im 12/92  soft-tissue]
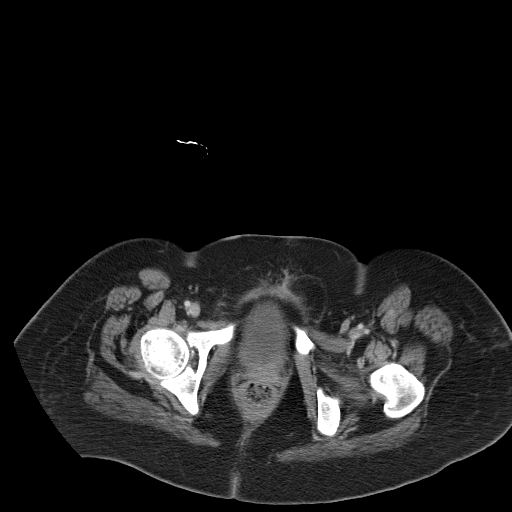
[im 18/92  soft-tissue]
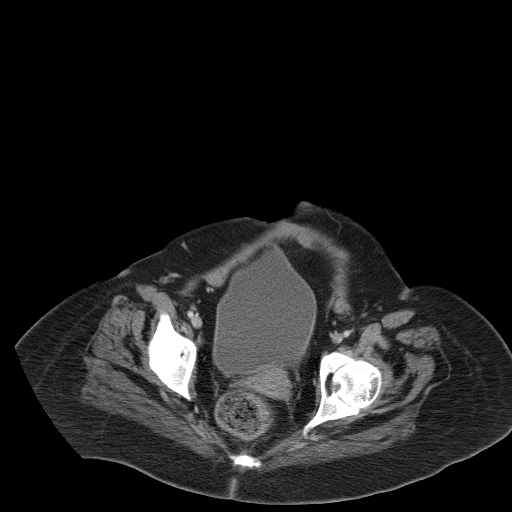
[im 27/92  soft-tissue]
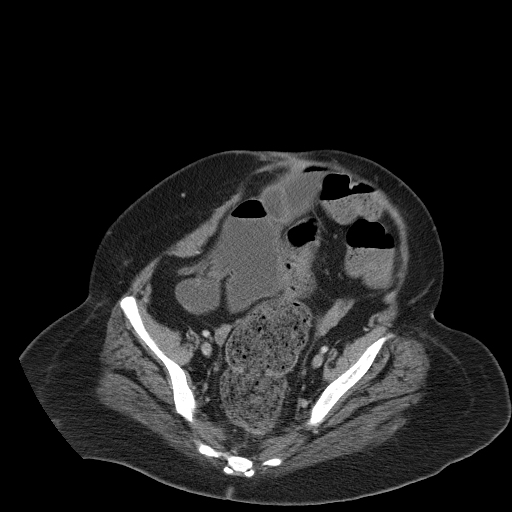
[im 33/92  soft-tissue]
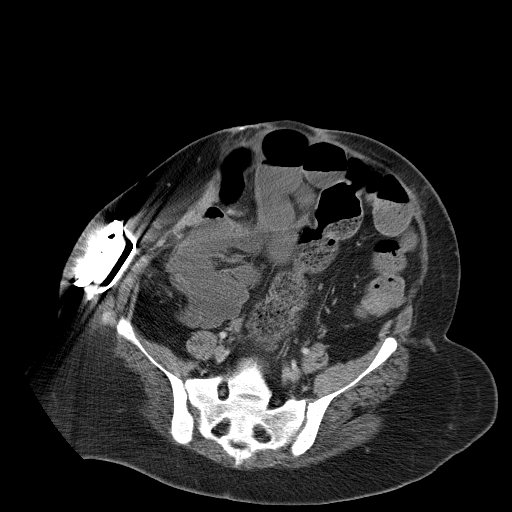
[im 39/92  soft-tissue]
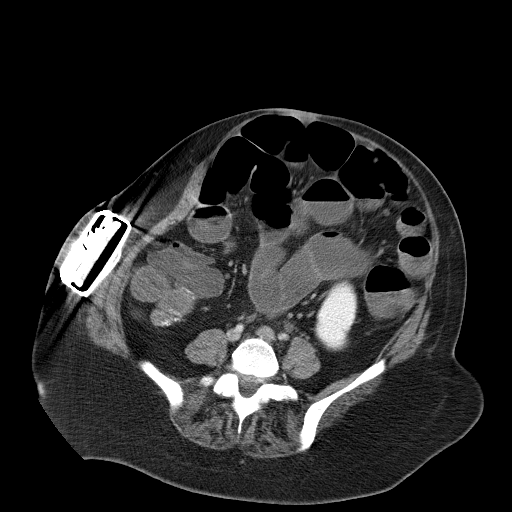
[im 47/92  soft-tissue]
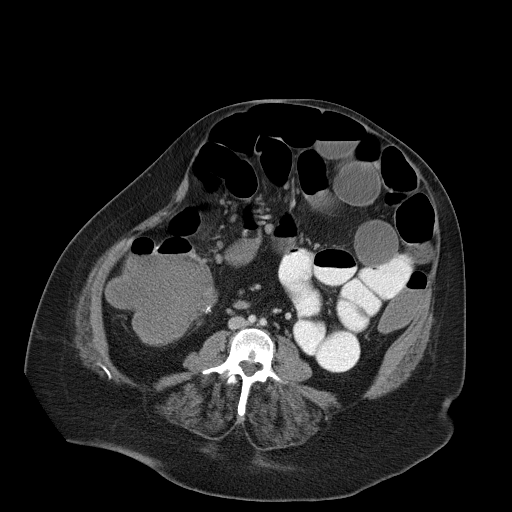
[im 53/92  soft-tissue]
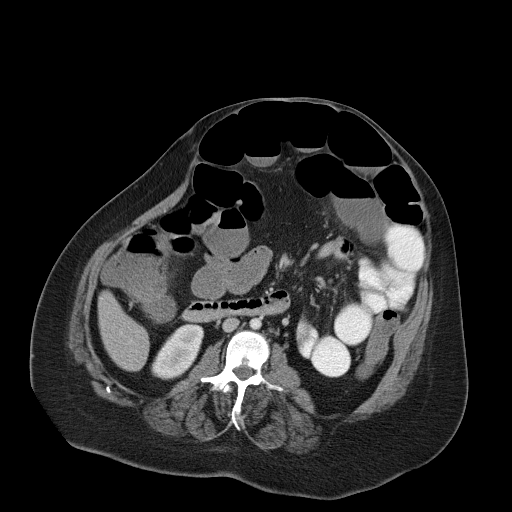
[im 59/92  soft-tissue]
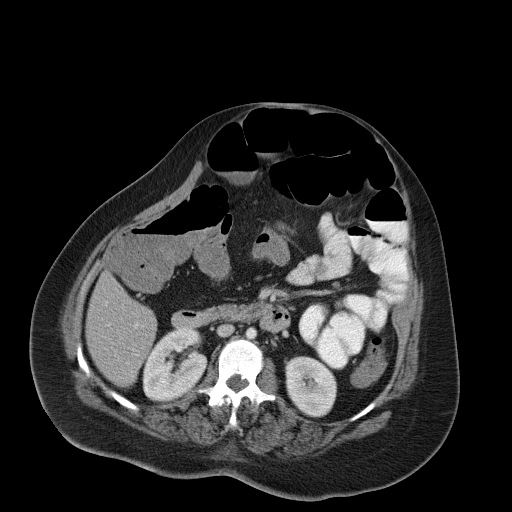
[im 59/92  bone]
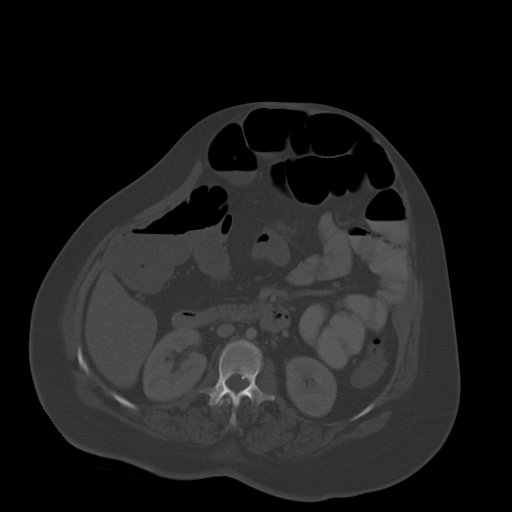
[im 65/92  soft-tissue]
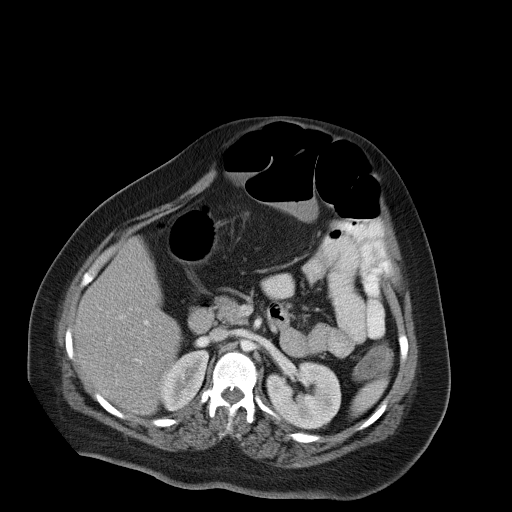
[im 74/92  soft-tissue]
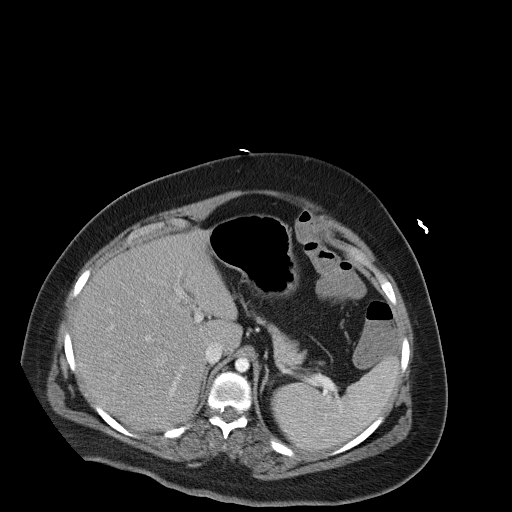
[im 80/92  soft-tissue]
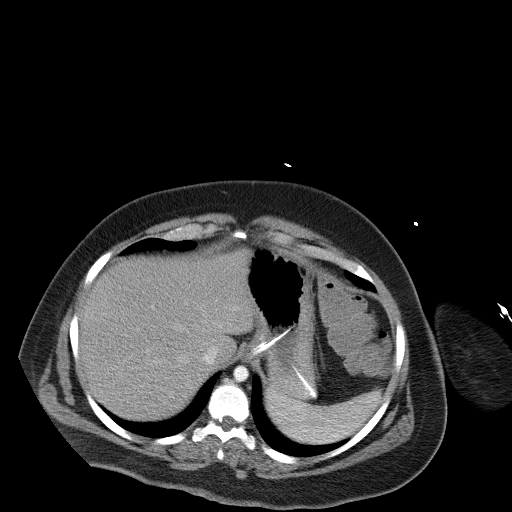
[im 80/92  lung]
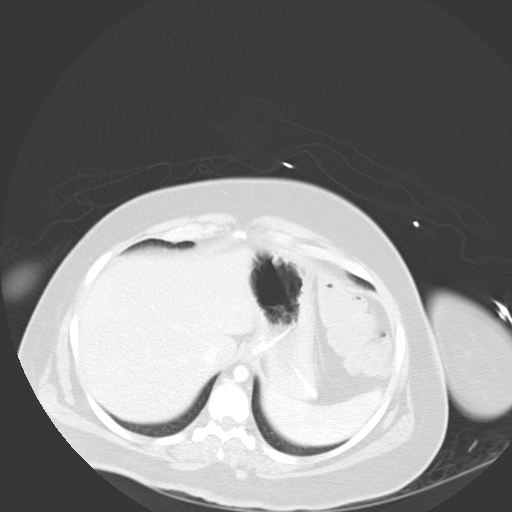
[im 83/92  lung]
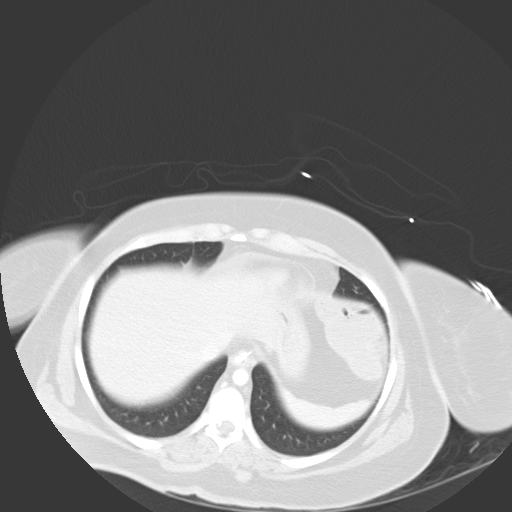
[im 86/92  soft-tissue]
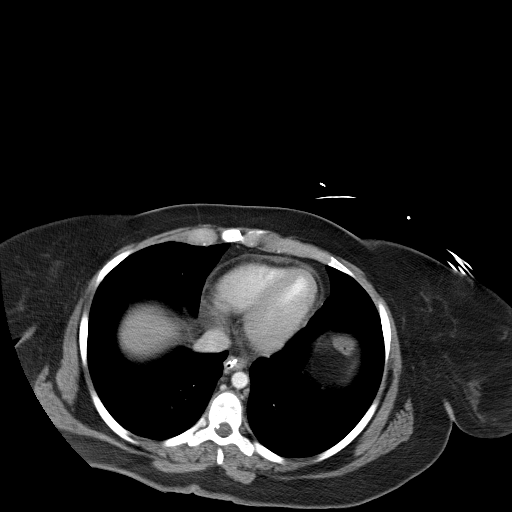
[im 86/92  lung]
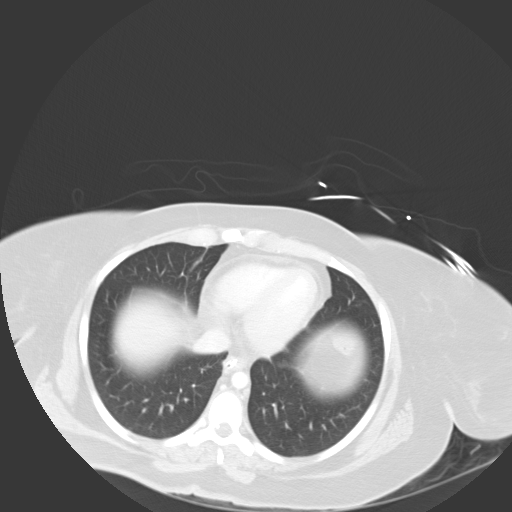
[im 89/92  lung]
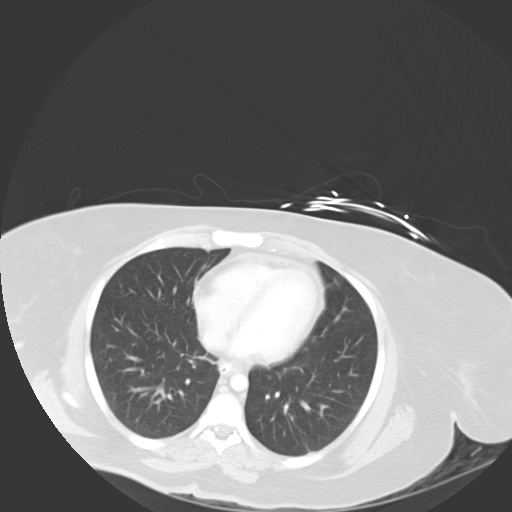

[15 of 32 positions shown; findings below may reference images not displayed]

FINDINGS: Lung bases are free of acute infiltrate or sizable effusion. A
nasogastric catheter is coiled within the stomach.

The gallbladder has been surgically removed. The liver, spleen,
adrenal glands and pancreas are all normal in their CT appearance.
The kidneys are well visualized bilaterally within normal
enhancement pattern.

There are mildly dilated loops of small bowel identified in the
jejunum and proximal ileum although a definitive transition zone is
not well appreciated. Given the air and fecal material within the
colon this likely represents a partial small bowel obstruction.
Correlation with the physical exam is recommended.

The bladder is well distended. Postsurgical changes are noted in the
proximal right femur. No pelvic mass lesion is seen. The appendix is
not visualized consistent with the given clinical history.
Postsurgical changes are noted in the ascending colon. A pain pump
is identified in the right lateral abdominal wall with the catheter
extending into the epidural space. No gross bony abnormality is
noted.
IMPRESSION: Multiple dilated loops of small bowel without definitive transition
zone. These changes are likely related to partial small bowel
obstruction secondary to adhesions given the multiple previous
surgeries.

Previous surgical changes

No other acute abnormality is noted.
